# Patient Record
Sex: Female | Born: 1955 | Race: White | Hispanic: No | Smoking: Former smoker
Health system: Southern US, Community
[De-identification: ages and names within clinical notes are randomized; demographics above are authoritative.]

## PROBLEM LIST (undated history)

## (undated) DIAGNOSIS — R079 Chest pain, unspecified: Secondary | ICD-10-CM

## (undated) DIAGNOSIS — R609 Edema, unspecified: Secondary | ICD-10-CM

## (undated) DIAGNOSIS — M25069 Hemarthrosis, unspecified knee: Secondary | ICD-10-CM

## (undated) DIAGNOSIS — R042 Hemoptysis: Secondary | ICD-10-CM

## (undated) DIAGNOSIS — I10 Essential (primary) hypertension: Secondary | ICD-10-CM

## (undated) DIAGNOSIS — F419 Anxiety disorder, unspecified: Secondary | ICD-10-CM

## (undated) DIAGNOSIS — R110 Nausea: Secondary | ICD-10-CM

## (undated) DIAGNOSIS — M2550 Pain in unspecified joint: Secondary | ICD-10-CM

## (undated) DIAGNOSIS — N200 Calculus of kidney: Secondary | ICD-10-CM

## (undated) DIAGNOSIS — F329 Major depressive disorder, single episode, unspecified: Secondary | ICD-10-CM

## (undated) DIAGNOSIS — J449 Chronic obstructive pulmonary disease, unspecified: Secondary | ICD-10-CM

## (undated) DIAGNOSIS — F32A Depression, unspecified: Secondary | ICD-10-CM

## (undated) HISTORY — DX: Hemoptysis: R04.2

## (undated) HISTORY — PX: TUBAL LIGATION: SHX77

## (undated) HISTORY — DX: Chest pain, unspecified: R07.9

## (undated) HISTORY — DX: Hemarthrosis, unspecified knee: M25.069

## (undated) HISTORY — PX: KNEE SURGERY: SHX244

## (undated) HISTORY — DX: Nausea: R11.0

## (undated) HISTORY — DX: Chronic obstructive pulmonary disease, unspecified: J44.9

## (undated) HISTORY — DX: Pain in unspecified joint: M25.50

---

## 2005-01-31 ENCOUNTER — Emergency Department (HOSPITAL_COMMUNITY): Admission: EM | Admit: 2005-01-31 | Discharge: 2005-01-31 | Payer: Self-pay | Admitting: Emergency Medicine

## 2005-02-03 ENCOUNTER — Emergency Department (HOSPITAL_COMMUNITY): Admission: EM | Admit: 2005-02-03 | Discharge: 2005-02-04 | Payer: Self-pay | Admitting: Emergency Medicine

## 2008-01-22 ENCOUNTER — Emergency Department (HOSPITAL_BASED_OUTPATIENT_CLINIC_OR_DEPARTMENT_OTHER): Admission: EM | Admit: 2008-01-22 | Discharge: 2008-01-22 | Payer: Self-pay | Admitting: Emergency Medicine

## 2008-02-02 ENCOUNTER — Encounter: Admission: RE | Admit: 2008-02-02 | Discharge: 2008-02-02 | Payer: Self-pay | Admitting: Gastroenterology

## 2008-02-05 ENCOUNTER — Emergency Department (HOSPITAL_BASED_OUTPATIENT_CLINIC_OR_DEPARTMENT_OTHER): Admission: EM | Admit: 2008-02-05 | Discharge: 2008-02-05 | Payer: Self-pay | Admitting: Emergency Medicine

## 2008-02-09 ENCOUNTER — Encounter: Admission: RE | Admit: 2008-02-09 | Discharge: 2008-02-09 | Payer: Self-pay | Admitting: Gastroenterology

## 2008-02-12 ENCOUNTER — Emergency Department (HOSPITAL_BASED_OUTPATIENT_CLINIC_OR_DEPARTMENT_OTHER): Admission: EM | Admit: 2008-02-12 | Discharge: 2008-02-12 | Payer: Self-pay | Admitting: Emergency Medicine

## 2008-02-13 ENCOUNTER — Emergency Department (HOSPITAL_BASED_OUTPATIENT_CLINIC_OR_DEPARTMENT_OTHER): Admission: EM | Admit: 2008-02-13 | Discharge: 2008-02-13 | Payer: Self-pay | Admitting: Emergency Medicine

## 2008-02-22 ENCOUNTER — Inpatient Hospital Stay (HOSPITAL_COMMUNITY): Admission: RE | Admit: 2008-02-22 | Discharge: 2008-02-24 | Payer: Self-pay | Admitting: Obstetrics and Gynecology

## 2008-02-22 ENCOUNTER — Encounter (INDEPENDENT_AMBULATORY_CARE_PROVIDER_SITE_OTHER): Payer: Self-pay | Admitting: Obstetrics and Gynecology

## 2008-03-04 ENCOUNTER — Encounter: Admission: RE | Admit: 2008-03-04 | Discharge: 2008-03-04 | Payer: Self-pay | Admitting: Obstetrics and Gynecology

## 2008-03-07 ENCOUNTER — Emergency Department (HOSPITAL_BASED_OUTPATIENT_CLINIC_OR_DEPARTMENT_OTHER): Admission: EM | Admit: 2008-03-07 | Discharge: 2008-03-07 | Payer: Self-pay | Admitting: Emergency Medicine

## 2008-04-13 ENCOUNTER — Emergency Department (HOSPITAL_BASED_OUTPATIENT_CLINIC_OR_DEPARTMENT_OTHER): Admission: EM | Admit: 2008-04-13 | Discharge: 2008-04-13 | Payer: Self-pay | Admitting: Emergency Medicine

## 2008-05-22 ENCOUNTER — Emergency Department (HOSPITAL_BASED_OUTPATIENT_CLINIC_OR_DEPARTMENT_OTHER): Admission: EM | Admit: 2008-05-22 | Discharge: 2008-05-22 | Payer: Self-pay | Admitting: Emergency Medicine

## 2008-07-01 HISTORY — PX: ABDOMINAL HYSTERECTOMY: SHX81

## 2008-07-01 HISTORY — PX: APPENDECTOMY: SHX54

## 2008-08-01 ENCOUNTER — Emergency Department (HOSPITAL_BASED_OUTPATIENT_CLINIC_OR_DEPARTMENT_OTHER): Admission: EM | Admit: 2008-08-01 | Discharge: 2008-08-01 | Payer: Self-pay | Admitting: Emergency Medicine

## 2009-02-06 ENCOUNTER — Emergency Department (HOSPITAL_BASED_OUTPATIENT_CLINIC_OR_DEPARTMENT_OTHER): Admission: EM | Admit: 2009-02-06 | Discharge: 2009-02-06 | Payer: Self-pay | Admitting: Emergency Medicine

## 2009-02-06 ENCOUNTER — Ambulatory Visit: Payer: Self-pay | Admitting: Diagnostic Radiology

## 2009-03-15 ENCOUNTER — Emergency Department (HOSPITAL_BASED_OUTPATIENT_CLINIC_OR_DEPARTMENT_OTHER): Admission: EM | Admit: 2009-03-15 | Discharge: 2009-03-16 | Payer: Self-pay | Admitting: Emergency Medicine

## 2009-03-15 ENCOUNTER — Emergency Department (HOSPITAL_BASED_OUTPATIENT_CLINIC_OR_DEPARTMENT_OTHER): Admission: EM | Admit: 2009-03-15 | Discharge: 2009-03-15 | Payer: Self-pay | Admitting: Emergency Medicine

## 2009-03-15 ENCOUNTER — Ambulatory Visit: Payer: Self-pay | Admitting: Radiology

## 2009-03-16 ENCOUNTER — Ambulatory Visit: Payer: Self-pay | Admitting: Diagnostic Radiology

## 2009-07-01 HISTORY — PX: COLON SURGERY: SHX602

## 2009-07-06 ENCOUNTER — Ambulatory Visit: Payer: Self-pay | Admitting: Diagnostic Radiology

## 2009-07-06 ENCOUNTER — Emergency Department (HOSPITAL_BASED_OUTPATIENT_CLINIC_OR_DEPARTMENT_OTHER): Admission: EM | Admit: 2009-07-06 | Discharge: 2009-07-06 | Payer: Self-pay | Admitting: Emergency Medicine

## 2009-09-13 ENCOUNTER — Emergency Department (HOSPITAL_BASED_OUTPATIENT_CLINIC_OR_DEPARTMENT_OTHER): Admission: EM | Admit: 2009-09-13 | Discharge: 2009-09-13 | Payer: Self-pay | Admitting: Emergency Medicine

## 2010-05-14 ENCOUNTER — Ambulatory Visit: Payer: Self-pay | Admitting: Diagnostic Radiology

## 2010-05-14 ENCOUNTER — Emergency Department (HOSPITAL_BASED_OUTPATIENT_CLINIC_OR_DEPARTMENT_OTHER): Admission: EM | Admit: 2010-05-14 | Discharge: 2010-05-14 | Payer: Self-pay | Admitting: Emergency Medicine

## 2010-05-27 ENCOUNTER — Ambulatory Visit: Payer: Self-pay | Admitting: Diagnostic Radiology

## 2010-05-27 ENCOUNTER — Emergency Department (HOSPITAL_BASED_OUTPATIENT_CLINIC_OR_DEPARTMENT_OTHER): Admission: EM | Admit: 2010-05-27 | Discharge: 2010-05-27 | Payer: Self-pay | Admitting: Emergency Medicine

## 2010-10-05 LAB — BASIC METABOLIC PANEL
CO2: 24 mEq/L (ref 19–32)
Chloride: 108 mEq/L (ref 96–112)
Creatinine, Ser: 0.6 mg/dL (ref 0.4–1.2)
GFR calc Af Amer: >60 mL/min (ref >60)
Glucose, Bld: 90 mg/dL (ref 70–99)
Sodium: 143 mEq/L (ref 135–145)

## 2010-10-05 LAB — URINALYSIS, ROUTINE W REFLEX MICROSCOPIC
Bilirubin Urine: NEGATIVE
Bilirubin Urine: NEGATIVE
Glucose, UA: NEGATIVE mg/dL
Hgb urine dipstick: NEGATIVE
Ketones, ur: NEGATIVE mg/dL
Nitrite: NEGATIVE
Protein, ur: NEGATIVE mg/dL
Protein, ur: NEGATIVE mg/dL
Urobilinogen, UA: 0.2 mg/dL (ref 0.0–1.0)
Urobilinogen, UA: 0.2 mg/dL (ref 0.0–1.0)

## 2010-10-05 LAB — CBC
HCT: 36.6 % (ref 36.0–46.0)
Platelets: 313 10*3/uL (ref 150–400)
RDW: 13.3 % (ref 11.5–15.5)

## 2010-10-05 LAB — POCT TOXICOLOGY PANEL: Opiates: POSITIVE

## 2010-10-05 LAB — COMPREHENSIVE METABOLIC PANEL
Albumin: 3.9 g/dL (ref 3.5–5.2)
Alkaline Phosphatase: 80 U/L (ref 39–117)
BUN: 18 mg/dL (ref 6–23)
GFR calc Af Amer: >60 mL/min (ref >60)
Potassium: 5 mEq/L (ref 3.5–5.1)
Sodium: 143 mEq/L (ref 135–145)
Total Protein: 7.3 g/dL (ref 6.0–8.3)

## 2010-10-05 LAB — SALICYLATE LEVEL: Salicylate Lvl: 1 mg/dL — ABNORMAL LOW (ref 2.8–20.0)

## 2010-10-05 LAB — ACETAMINOPHEN LEVEL: Acetaminophen (Tylenol), Serum: <10 ug/mL — ABNORMAL LOW (ref 10–30)

## 2010-10-05 LAB — DIFFERENTIAL
Basophils Relative: 1 % (ref 0–1)
Monocytes Absolute: 0.4 10*3/uL (ref 0.1–1.0)
Monocytes Relative: 8 % (ref 3–12)
Neutro Abs: 3.5 10*3/uL (ref 1.7–7.7)

## 2010-10-05 LAB — ETHANOL: Alcohol, Ethyl (B): <5 mg/dL (ref 0–10)

## 2010-10-05 LAB — LIPASE, BLOOD: Lipase: 205 U/L (ref 23–300)

## 2010-10-06 LAB — LIPASE, BLOOD: Lipase: 92 U/L (ref 23–300)

## 2010-10-06 LAB — DIFFERENTIAL
Eosinophils Absolute: 0 10*3/uL (ref 0.0–0.7)
Lymphocytes Relative: 14 % (ref 12–46)
Lymphs Abs: 1.1 10*3/uL (ref 0.7–4.0)
Neutro Abs: 5.9 10*3/uL (ref 1.7–7.7)
Neutrophils Relative %: 73 % (ref 43–77)

## 2010-10-06 LAB — URINALYSIS, ROUTINE W REFLEX MICROSCOPIC
Ketones, ur: 15 mg/dL — AB
Nitrite: NEGATIVE
Specific Gravity, Urine: 1.022 (ref 1.005–1.030)
pH: 6 (ref 5.0–8.0)

## 2010-10-06 LAB — CBC
Hemoglobin: 16.5 g/dL — ABNORMAL HIGH (ref 12.0–15.0)
MCHC: 34 g/dL (ref 30.0–36.0)
MCV: 98 fL (ref 78.0–100.0)
RBC: 4.95 MIL/uL (ref 3.87–5.11)
RDW: 11.8 % (ref 11.5–15.5)

## 2010-10-06 LAB — COMPREHENSIVE METABOLIC PANEL
CO2: 30 mEq/L (ref 19–32)
Calcium: 9.9 mg/dL (ref 8.4–10.5)
Creatinine, Ser: 0.6 mg/dL (ref 0.4–1.2)
GFR calc non Af Amer: >60 mL/min (ref >60)
Glucose, Bld: 121 mg/dL — ABNORMAL HIGH (ref 70–99)
Total Protein: 7.1 g/dL (ref 6.0–8.3)

## 2010-11-06 ENCOUNTER — Emergency Department (INDEPENDENT_AMBULATORY_CARE_PROVIDER_SITE_OTHER): Payer: Self-pay

## 2010-11-06 ENCOUNTER — Emergency Department (HOSPITAL_BASED_OUTPATIENT_CLINIC_OR_DEPARTMENT_OTHER)
Admission: EM | Admit: 2010-11-06 | Discharge: 2010-11-07 | Disposition: A | Discharge status: Home / Self Care | Payer: Self-pay | Attending: Emergency Medicine | Admitting: Emergency Medicine

## 2010-11-06 DIAGNOSIS — M47812 Spondylosis without myelopathy or radiculopathy, cervical region: Secondary | ICD-10-CM | POA: Insufficient documentation

## 2010-11-06 DIAGNOSIS — M79609 Pain in unspecified limb: Secondary | ICD-10-CM

## 2010-11-06 DIAGNOSIS — W108XXA Fall (on) (from) other stairs and steps, initial encounter: Secondary | ICD-10-CM

## 2010-11-06 DIAGNOSIS — S0180XA Unspecified open wound of other part of head, initial encounter: Secondary | ICD-10-CM | POA: Insufficient documentation

## 2010-11-06 DIAGNOSIS — Z043 Encounter for examination and observation following other accident: Secondary | ICD-10-CM

## 2010-11-06 DIAGNOSIS — F17200 Nicotine dependence, unspecified, uncomplicated: Secondary | ICD-10-CM | POA: Insufficient documentation

## 2010-11-06 DIAGNOSIS — R51 Headache: Secondary | ICD-10-CM

## 2010-11-13 NOTE — Op Note (Signed)
NAMEMarland Kitchen  Annette Palmer NO.:  [id number]   MEDICAL RECORD NO.:  [id number]          PATIENT TYPE:  INP   LOCATION:  9320                          FACILITY:  [facility]   PHYSICIAN:  Gerald Leitz, MD          DATE OF BIRTH:  [birth year]   DATE OF PROCEDURE:  02/22/2008  DATE OF DISCHARGE:                               OPERATIVE REPORT   PREOPERATIVE DIAGNOSES:  1. Pelvic pain.  2. Right adnexal mass.   POSTOPERATIVE DIAGNOSIS:  1. Pelvic pain.  2. Right adnexal mass.  3. Pelvic adhesive disease.   PROCEDURE:  Exploratory laparotomy, extensive lysis of adhesions, right  ureterolysis, removal of the right adnexal mass and partial omentectomy.   SURGEON:  Gerald Leitz, M.D.   ASSISTANT:  Dr. Bing Neighbors. [place].   ANESTHESIA:  General.   FINDINGS:  Extensive pelvic adhesive disease, adhesions of the right  adnexal mass of the right ureter and colon.   SPECIMEN:  Right adnexal mass, portions of the omentum.   DISPOSITION:  The specimen pathology.   ESTIMATED BLOOD LOSS:  300 mL.   COMPLICATIONS:  None.   PROCEDURE:  The patient is taken to the operating room where she was  placed under general anesthesia.  She was prepped and draped in the  usual sterile fashion.  A Pfannenstiel skin incision was made with  scalpel, carried down to the underlying layer of fascia.  The fascia was  incised in the midline and the incision was extended laterally with Mayo  scissors.  The superior aspect of the fascial incision was grasped with  Kocher clamps, elevated underlying rectus muscles were dissected off.  This was repeated on the inferior aspect of the fascial incision.  The  rectus muscles were separated in the midline.  The peritoneum was  identified and entered bluntly.  She was noted to have some adhesions in  the omentum to the anterior abdominal wall.  These were clamped with  Kelly clamps, transected, suture ligated with 0-Vicryl.  Excellent  hemostasis was noted.   Small portion of the omentum was sent to  pathology.  Balfour retractor was placed into the abdominal cavity.  The  bowel was packed away with moist laparotomy sponges.  The patient was  found to have adhesions of the colon to the right adnexal mass.  These  adhesions were taken down via sharp dissection very carefully.  Lysis of  adhesions took approximately an hour and 50 minutes of the case.  She  has also noted to have adhesion of the adnexal mass to the right ureter.  The ureter was isolated with a vessel loop.  It was dissected from the  right adnexal mass.  No definitive right ovary was seen and no  infundibulopelvic ligaments were identified.  The patient was noted to  have some adhesion of the right adnexal mass to the sidewall this was  dissected with sharp dissection.  The adnexal mass was removed.  The  patient was noted to have some oozing from the pelvic sidewall.  Hemostasis was obtained with electrocautery as  well as medium clamps.  She is also noted to have some bleeding along the ureter.  Medium  surgical clamps were placed taking care to avoid the ureter.  Hemostasis  was assured.  Avitene was placed once hemostasis was assured.  Specimen  was removed.  All of the instruments and sponges were removed from the  abdomen and the fascia was re-approximated with 0 PDS.  Skin was closed  with staples.  Sponge lap, needle counts were correct x2.  The patient  was given 900 mg of clindamycin prior to beginning the procedure.  She  was taken to the recovery room awake and in stable condition.      Gerald Leitz, MD  Electronically Signed     TC/MEDQ  D:  02/22/2008  T:  02/23/2008  Job:  [id number]

## 2010-11-13 NOTE — H&P (Signed)
NAME:  Annette Palmer NO.:  [id number]   MEDICAL RECORD NO.:  [id number]          PATIENT TYPE:  AMB   LOCATION:  SDC                           [facility]ILITY:  [facility]   PHYSICIAN:  Gerald Leitz, MD          DATE OF BIRTH:  Apr 26, 1956   DATE OF ADMISSION:  DATE OF DISCHARGE:                              HISTORY & PHYSICAL   The patient is scheduled for surgery on February 22, 2008.   HISTORY OF PRESENT ILLNESS:  This 55 year old G2, P2 with right lower  quadrant pelvic pain for the past 4 months, who had a CT scan performed  on February 02, 2008, at [facility] which showed a 6-cm simple  right ovarian cyst.  The patient is status post abdominal hysterectomy,  left oophorectomy, as well as appendectomy at the age of 40 due to  ovarian cyst at that time.  She desires removal of the right ovary, I  given the significant pain that is uncontrolled with Tylenol and  Percocet.   PAST OB HISTORY:  Spontaneous vaginal delivery x2.   GYN HISTORY:  History of hysterectomy, left oophorectomy at the age of  7, utero-ovarian cyst.  The patient is unsure of her last Pap smear.  No history of sexually transmitted diseases.   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   ALLERGIES:  PENICILLIN and SULFA.   CURRENT MEDICATIONS:  Zoloft.   SURGERIES:  Hysterectomy, appendectomy, left salpingo-oophorectomy, and  left knee surgery.   SOCIAL HISTORY:  The patient is divorced.  She is currently unemployed.  She smokes approximately a pack per day of cigarettes over the past 15  years.  She denies alcohol or illicit drug use   FAMILY HISTORY:  Negative for breast, ovarian, or colon cancer.   REVIEW OF SYSTEMS:  Positive for nausea.  No emesis, no diarrhea,  constipation, nor dysuria.   PHYSICAL EXAMINATION:  VITAL SIGNS:  Blood pressure 124/80, height 65  inches, weight 110 pounds, and temperature 97.2.  CARDIOVASCULAR:  Regular rate and rhythm.  LUNGS:   Clear to auscultation bilaterally.  ABDOMEN:  Soft and tender in the right lower quadrant.  Positive bowel  sounds.  No rebound.  No guarding.  EXTREMITIES:  No clubbing, cyanosis, or edema.  PELVIC:  Normal external female genitalia.  No vaginal lesions noted.  Cervix is absent.  Right adnexal mass with tenderness to palpation.   IMPRESSION AND PLAN:  Pelvic pain most likely secondary to right ovarian  cyst.  The patient desires therapy with right oophorectomy.  Risks,  benefits, and alternatives of the surgery were discussed with the  patient including, but not limited to infection, bleeding, damage to  bowel, bladder, or surrounding organs with the need for further  surgery.  She is informed that the right ovary may not be the cause of  her pelvic pain and if it is not the source, her pelvic pain may  continue after surgery.  She voiced understanding of all risks and  desires to proceed with right oophorectomy.  Gerald Leitz, MD  Electronically Signed     TC/MEDQ  D:  02/18/2008  T:  02/19/2008  Job:  [id number]

## 2010-11-16 NOTE — Discharge Summary (Signed)
NAMEMarland Kitchen  Annette Palmer NO.:  [id number]   MEDICAL RECORD NO.:  [id number]          PATIENT TYPE:  INP   LOCATION:  9320                          [facility]   PHYSICIAN:  Gerald Leitz, MD          DATE OF BIRTH:  1956/04/24   DATE OF ADMISSION:  02/22/2008  DATE OF DISCHARGE:  02/24/2008                               DISCHARGE SUMMARY   ADMISSION DIAGNOSES:  1. Pelvic pain.  2. Right adnexal mass.   DISCHARGE DIAGNOSES:  1. Pelvic pain.  2. Right adnexal mass.  3. Status post exploratory laparotomy with extensive lysis of      adhesions, right ureterolysis, removal of right adnexal mass with      partial omentectomy.   BRIEF HOSPITAL COURSE:  The patient was admitted on February 22, 2008  after undergoing exploratory laparotomy, extensive lysis of adhesions,  right ureterolysis, and removal of right adnexal mass with partial  omentectomy secondary to pelvic pain and right adnexal mass.  She did  well postoperatively.  On postop day #1, her hemoglobin was 11.8.  She  was discharged home on postop day #2 on the following medications,  Motrin and Percocet.  She was to follow up for staple removal on February 26, 2008 and for postoperative visit 4 weeks later.   CONDITION AT DISCHARGE:  Improved and stable.       Gerald Leitz, MD  Electronically Signed     TC/MEDQ  D:  03/15/2008  T:  03/16/2008  Job:  [id number]

## 2011-03-29 LAB — URINALYSIS, ROUTINE W REFLEX MICROSCOPIC
Bilirubin Urine: NEGATIVE
Bilirubin Urine: NEGATIVE
Glucose, UA: NEGATIVE
Hgb urine dipstick: NEGATIVE
Ketones, ur: NEGATIVE
Nitrite: NEGATIVE
Specific Gravity, Urine: 1.026
pH: 6
pH: 6

## 2011-03-29 LAB — DIFFERENTIAL
Basophils Relative: 1
Eosinophils Absolute: 0.1
Monocytes Absolute: 0.4
Monocytes Relative: 9
Neutrophils Relative %: 47

## 2011-03-29 LAB — COMPREHENSIVE METABOLIC PANEL
ALT: 19
Albumin: 4
Alkaline Phosphatase: 48
Chloride: 108
Glucose, Bld: 86
Potassium: 4.2
Sodium: 142
Total Protein: 6.9

## 2011-03-29 LAB — CBC
Hemoglobin: 14.4
RDW: 12.5
WBC: 4.5

## 2011-08-14 ENCOUNTER — Encounter (HOSPITAL_BASED_OUTPATIENT_CLINIC_OR_DEPARTMENT_OTHER): Payer: Self-pay | Admitting: *Deleted

## 2011-08-14 ENCOUNTER — Emergency Department (HOSPITAL_BASED_OUTPATIENT_CLINIC_OR_DEPARTMENT_OTHER)
Admission: EM | Admit: 2011-08-14 | Discharge: 2011-08-14 | Disposition: A | Discharge status: Home / Self Care | Payer: Self-pay | Attending: Emergency Medicine | Admitting: Emergency Medicine

## 2011-08-14 DIAGNOSIS — F10929 Alcohol use, unspecified with intoxication, unspecified: Secondary | ICD-10-CM

## 2011-08-14 DIAGNOSIS — F1010 Alcohol abuse, uncomplicated: Secondary | ICD-10-CM | POA: Insufficient documentation

## 2011-08-14 NOTE — ED Provider Notes (Signed)
History     CSN: [id number]  Arrival date & time 08/14/11  1478   First MD Initiated Contact with Patient 08/14/11 2045      Chief Complaint  Patient presents with  . Alcohol Intoxication    (Consider location/radiation/quality/duration/timing/severity/associated sxs/prior treatment) Patient is a 56 y.o. female presenting with intoxication. The history is provided by the patient and a friend. No language interpreter was used.  Alcohol Intoxication This is a new problem. The current episode started today. The problem occurs constantly. The problem has been unchanged. Pertinent negatives include no vomiting. The symptoms are aggravated by nothing. She has tried nothing for the symptoms. The treatment provided no relief.   Pt took 2 valium and a 10 mg morphine at 3pm today.  Pt had 5 shots of alcohol.  Pt sent here by her Preacher because he was concerned about medications pt took.  Pt reports she had 3 family members die this week.  Pt reports medications were hers.  Pt is here with a friend who will be with her.  Pt denies suicide attempt.  Pt reports she took to get through.   History reviewed. No pertinent past medical history.  History reviewed. No pertinent past surgical history.  No family history on file.  History  Substance Use Topics  . Smoking status: Current Everyday Smoker  . Smokeless tobacco: Not on file  . Alcohol Use: Yes    OB History    Grav Para Term Preterm Abortions TAB SAB Ect Mult Living                  Review of Systems  Gastrointestinal: Negative for vomiting.  Psychiatric/Behavioral: Negative for suicidal ideas, hallucinations, confusion, self-injury and agitation. The patient is nervous/anxious.   All other systems reviewed and are negative.    Allergies  Penicillins  Home Medications   Current Outpatient Rx  Name Route Sig Dispense Refill  . ALPRAZOLAM 1 MG PO TABS Oral Take 1 mg by mouth at bedtime.    . CYANOCOBALAMIN 5000 MCG/ML SL  LIQD Sublingual Place 5,000 mcg under the tongue daily.    Marland Kitchen PRENATAL MULTIVITAMIN CH Oral Take 1 tablet by mouth daily.    . SERTRALINE HCL 100 MG PO TABS Oral Take 100 mg by mouth daily.      BP 130/94  Pulse 75  Temp(Src) 97.6 F (36.4 C) (Oral)  Resp 18  SpO2 100%  Physical Exam  Nursing note and vitals reviewed. Constitutional: She appears well-developed and well-nourished.  HENT:  Head: Normocephalic and atraumatic.  Right Ear: External ear normal.  Left Ear: External ear normal.  Nose: Nose normal.  Mouth/Throat: Oropharynx is clear and moist.  Eyes: Conjunctivae and EOM are normal. Pupils are equal, round, and reactive to light.  Neck: Normal range of motion. Neck supple.  Cardiovascular: Normal rate.   Pulmonary/Chest: Effort normal.  Abdominal: Soft.  Neurological: She is alert.  Skin: Skin is warm.  Psychiatric: Thought content normal.    ED Course  Procedures (including critical care time)  Labs Reviewed  ETHANOL - Abnormal; Notable for the following:    Alcohol, Ethyl (B) 142 (*)    All other components within normal limits   No results found.   No diagnosis found.    MDM  Pt agrees to not drink or take any other medication today.  Pt observed in ED.  Pt is awake and alert the complete time.  Pt eating and drinking normally.  Pt's friend will  be with her tonight and agrees to return if any problems.        Langston Masker, [place] 08/14/11 2057

## 2011-08-14 NOTE — ED Notes (Signed)
Son ,daughter -in-law and granddaughter all died x 4 days ago by drowning.  Was at funeral home tonight.  Admits to having 5 shots of liquer earlier today and her usual xanax and zoloft.  Family was concerned and wanted her checked.

## 2011-08-14 NOTE — ED Notes (Signed)
Given something to eat and drink  And tolerated well

## 2011-08-14 NOTE — Discharge Instructions (Signed)
Alcohol Intoxication Alcohol intoxication means your blood alcohol level is above legal limits. Alcohol is a drug. It has serious side effects. These side effects can include:  Damage to your organs (liver, nervous system, and blood system).   Unclear thinking.   Slowed reflexes.   Decreased muscle coordination.  HOME CARE  Do not drink and drive.   Do not drink alcohol if you are taking medicine or using other drugs. Doing so can cause serious medical problems or even death.   Drink enough water and fluids to keep your pee (urine) clear or pale yellow.   Eat healthy foods.   Only take medicine as told by your doctor.   Join an alcohol support group.  GET HELP RIGHT AWAY IF:  You become shaky when you stop drinking.   Your thinking is unclear or you become confused.   You throw up (vomit) blood. It may look bright red or like coffee grounds.   You notice blood in your poop (bowel movements).   You become lightheaded or pass out (faint).  MAKE SURE YOU:   Understand these instructions.   Will watch your condition.   Will get help right away if you are not doing well or get worse.  Document Released: 12/04/2007 Document Revised: 02/27/2011 Document Reviewed: 12/04/2009 [facility] Patient Information 2012 [place], [place].  Do not take any other medications tonight.  Return to the Emergency department if increased sedation or problems.

## 2011-08-14 NOTE — ED Notes (Addendum)
Patient has taken "2 valium", "5 shots", and "one morphine". Patient is coherent and answering questions appropriately. Patient admits to substances taken tonight, but is also coming to [place] from the funeral home for  her son who drowned recently (with his wife and daughter)

## 2011-08-19 NOTE — ED Provider Notes (Signed)
History/physical exam/procedure(s) were performed by non-physician practitioner and as supervising physician I was immediately available for consultation/collaboration. I have reviewed all notes and am in agreement with care and plan.   Baer Hinton S Jochebed Bills, MD 08/19/11 0724 

## 2011-10-24 ENCOUNTER — Emergency Department (INDEPENDENT_AMBULATORY_CARE_PROVIDER_SITE_OTHER): Payer: Self-pay

## 2011-10-24 ENCOUNTER — Encounter (HOSPITAL_BASED_OUTPATIENT_CLINIC_OR_DEPARTMENT_OTHER): Payer: Self-pay | Admitting: *Deleted

## 2011-10-24 ENCOUNTER — Emergency Department (HOSPITAL_BASED_OUTPATIENT_CLINIC_OR_DEPARTMENT_OTHER)
Admission: EM | Admit: 2011-10-24 | Discharge: 2011-10-24 | Disposition: A | Discharge status: Home / Self Care | Payer: Self-pay | Attending: Emergency Medicine | Admitting: Emergency Medicine

## 2011-10-24 DIAGNOSIS — R0789 Other chest pain: Secondary | ICD-10-CM | POA: Insufficient documentation

## 2011-10-24 DIAGNOSIS — R079 Chest pain, unspecified: Secondary | ICD-10-CM

## 2011-10-24 DIAGNOSIS — I10 Essential (primary) hypertension: Secondary | ICD-10-CM | POA: Insufficient documentation

## 2011-10-24 DIAGNOSIS — F341 Dysthymic disorder: Secondary | ICD-10-CM | POA: Insufficient documentation

## 2011-10-24 HISTORY — DX: Major depressive disorder, single episode, unspecified: F32.9

## 2011-10-24 HISTORY — DX: Essential (primary) hypertension: I10

## 2011-10-24 HISTORY — DX: Depression, unspecified: F32.A

## 2011-10-24 HISTORY — DX: Anxiety disorder, unspecified: F41.9

## 2011-10-24 LAB — COMPREHENSIVE METABOLIC PANEL
Albumin: 4.6 g/dL (ref 3.5–5.2)
Alkaline Phosphatase: 44 U/L (ref 39–117)
BUN: 17 mg/dL (ref 6–23)
Calcium: 10.2 mg/dL (ref 8.4–10.5)
Creatinine, Ser: 0.7 mg/dL (ref 0.50–1.10)
GFR calc Af Amer: >90 mL/min (ref >90)
Glucose, Bld: 90 mg/dL (ref 70–99)
Potassium: 4.5 mEq/L (ref 3.5–5.1)
Total Protein: 7.5 g/dL (ref 6.0–8.3)

## 2011-10-24 LAB — CBC
Hemoglobin: 15.2 g/dL — ABNORMAL HIGH (ref 12.0–15.0)
MCH: 31.8 pg (ref 26.0–34.0)
MCHC: 35.2 g/dL (ref 30.0–36.0)
MCV: 90.4 fL (ref 78.0–100.0)

## 2011-10-24 LAB — DIFFERENTIAL
Basophils Relative: 0 % (ref 0–1)
Eosinophils Absolute: 0.1 10*3/uL (ref 0.0–0.7)
Eosinophils Relative: 1 % (ref 0–5)
Lymphs Abs: 1.9 10*3/uL (ref 0.7–4.0)
Monocytes Relative: 6 % (ref 3–12)
Neutrophils Relative %: 70 % (ref 43–77)

## 2011-10-24 LAB — CARDIAC PANEL(CRET KIN+CKTOT+MB+TROPI)
CK, MB: 2.3 ng/mL (ref 0.3–4.0)
Relative Index: INVALID (ref 0.0–2.5)
Total CK: 88 U/L (ref 7–177)
Troponin I: <0.3 ng/mL (ref <0.30)

## 2011-10-24 LAB — LIPASE, BLOOD: Lipase: 69 U/L — ABNORMAL HIGH (ref 11–59)

## 2011-10-24 MED ORDER — HYDROCODONE-ACETAMINOPHEN 5-500 MG PO TABS: 1.0000 | ORAL_TABLET | Freq: Four times a day (QID) | ORAL | Status: AC | PRN

## 2011-10-24 NOTE — ED Notes (Signed)
PCXR obtained.

## 2011-10-24 NOTE — ED Provider Notes (Signed)
History     CSN: [id number]  Arrival date & time 10/24/11  1555   First MD Initiated Contact with Patient 10/24/11 1607      No chief complaint on file.   (Consider location/radiation/quality/duration/timing/severity/associated sxs/prior treatment) HPI Comments: Patient states chest pain for the past nine months.  It is sharp in nature, comes and goes.  Does not seem to be associated with exertion or cough.  No fevers or chills.  She does smoke 1-2 ppd.    Patient is a 56 y.o. female presenting with chest pain. The history is provided by the patient.  Chest Pain     Past Medical History  Diagnosis Date  . Hypertension   . Depression   . Anxiety     Past Surgical History  Procedure Date  . Abdominal hysterectomy   . Knee surgery   . Colon surgery     No family history on file.  History  Substance Use Topics  . Smoking status: Current Everyday Smoker -- 1.0 packs/day  . Smokeless tobacco: Not on file  . Alcohol Use: Yes    OB History    Grav Para Term Preterm Abortions TAB SAB Ect Mult Living                  Review of Systems  Cardiovascular: Positive for chest pain.  All other systems reviewed and are negative.    Allergies  Penicillins  Home Medications   Current Outpatient Rx  Name Route Sig Dispense Refill  . ALPRAZOLAM 1 MG PO TABS Oral Take 1 mg by mouth at bedtime.    . CYANOCOBALAMIN 5000 MCG/ML SL LIQD Sublingual Place 5,000 mcg under the tongue daily.    Marland Kitchen PRENATAL MULTIVITAMIN CH Oral Take 1 tablet by mouth daily.    . SERTRALINE HCL 100 MG PO TABS Oral Take 100 mg by mouth daily.      BP 170/82  Pulse 76  Temp(Src) 98.3 F (36.8 C) (Oral)  Resp 20  Ht 5\' 6"  (1.676 m)  Wt 101 lb (45.813 kg)  BMI 16.30 kg/m2  SpO2 100%  Physical Exam  Nursing note and vitals reviewed. Constitutional: She is oriented to person, place, and time. She appears well-developed and well-nourished. No distress.  HENT:  Head: Normocephalic and  atraumatic.  Neck: Normal range of motion. Neck supple.  Cardiovascular: Normal rate and regular rhythm.  Exam reveals no gallop and no friction rub.   No murmur heard. Pulmonary/Chest: Effort normal and breath sounds normal. No respiratory distress. She has no wheezes.  Abdominal: Soft. Bowel sounds are normal. She exhibits no distension. There is no tenderness.  Musculoskeletal: Normal range of motion.  Neurological: She is alert and oriented to person, place, and time.  Skin: Skin is warm and dry. She is not diaphoretic.    ED Course  Procedures (including critical care time)   Labs Reviewed  CBC  DIFFERENTIAL  COMPREHENSIVE METABOLIC PANEL  LIPASE, BLOOD  CARDIAC PANEL(CRET KIN+CKTOT+MB+TROPI)   No results found.   No diagnosis found.   Date: 10/24/2011  Rate: 70  Rhythm: normal sinus rhythm  QRS Axis: normal  Intervals: normal  ST/T Wave abnormalities: normal  Conduction Disutrbances:none  Narrative Interpretation:   Old EKG Reviewed: unchanged    MDM  The labs and ekg look okay.  She has been having these symptoms for the past 9 months and have worsened since she lost her son to a drowning two months ago.  I doubt this is  cardiac.  She will be discharged with nsaids and lortab.  To follow up with pcp in the next few days, return prn if worsens.        Geoffery Lyons, MD 10/24/11 1739

## 2011-10-24 NOTE — ED Notes (Signed)
MD at bedside. 

## 2011-10-24 NOTE — Discharge Instructions (Signed)
Chest Pain (Nonspecific) Chest pain has many causes. Your pain could be caused by something serious, such as a heart attack or a blood clot in the lungs. It could also be caused by something less serious, such as a chest bruise or a virus. Follow up with your doctor. More lab tests or other studies may be needed to find the cause of your pain. Most of the time, nonspecific chest pain will improve within 2 to 3 days of rest and mild pain medicine. HOME CARE  For chest bruises, you may put ice on the sore area for 15 to 20 minutes, 3 to 4 times a day. Do this only if it makes you or your child feel better.   Put ice in a plastic bag.   Place a towel between the skin and the bag.   Rest for the next 2 to 3 days.   Go back to work if the pain improves.   See your doctor if the pain lasts longer than 1 to 2 weeks.   Only take medicine as told by your doctor.   Quit smoking if you smoke.  GET HELP RIGHT AWAY IF:   There is more pain or pain that spreads to the arm, neck, jaw, back, or belly (abdomen).   You or your child has shortness of breath.   You or your child coughs more than usual or coughs up blood.   You or your child has very bad back or belly pain, feels sick to his or her stomach (nauseous), or throws up (vomits).   You or your child has very bad weakness.   You or your child passes out (faints).   You or your child has a temperature by mouth above 102 F (38.9 C), not controlled by medicine.  Any of these problems may be serious and may be an emergency. Do not wait to see if the problems will go away. Get medical help right away. Call your local emergency services 911 in [place] Do not drive yourself to the hospital. MAKE SURE YOU:   Understand these instructions.   Will watch this condition.   Will get help right away if you or your child is not doing well or gets worse.  Document Released: 12/04/2007 Document Revised: 06/06/2011 Document Reviewed:  12/04/2007 [employer] Patient Information 2012 [employer]. 

## 2011-10-24 NOTE — ED Notes (Signed)
Report received from Babs Sciara, RN, care assumed.

## 2011-10-24 NOTE — ED Notes (Signed)
Productive cough with yellow and dark brown sputum.

## 2011-10-24 NOTE — ED Notes (Signed)
Sharp pain in her left chest for a week. States pain makes her "holler". Went to her MD and was told to come here. Stopped taking her BP medication over a month ago.

## 2011-12-29 ENCOUNTER — Emergency Department (HOSPITAL_BASED_OUTPATIENT_CLINIC_OR_DEPARTMENT_OTHER): Payer: Self-pay

## 2011-12-29 ENCOUNTER — Encounter (HOSPITAL_BASED_OUTPATIENT_CLINIC_OR_DEPARTMENT_OTHER): Payer: Self-pay | Admitting: *Deleted

## 2011-12-29 ENCOUNTER — Emergency Department (HOSPITAL_BASED_OUTPATIENT_CLINIC_OR_DEPARTMENT_OTHER)
Admission: EM | Admit: 2011-12-29 | Discharge: 2011-12-29 | Disposition: A | Discharge status: Home / Self Care | Payer: Self-pay | Attending: Emergency Medicine | Admitting: Emergency Medicine

## 2011-12-29 DIAGNOSIS — F17200 Nicotine dependence, unspecified, uncomplicated: Secondary | ICD-10-CM | POA: Insufficient documentation

## 2011-12-29 DIAGNOSIS — Z79899 Other long term (current) drug therapy: Secondary | ICD-10-CM | POA: Insufficient documentation

## 2011-12-29 DIAGNOSIS — I10 Essential (primary) hypertension: Secondary | ICD-10-CM | POA: Insufficient documentation

## 2011-12-29 DIAGNOSIS — R1011 Right upper quadrant pain: Secondary | ICD-10-CM | POA: Insufficient documentation

## 2011-12-29 DIAGNOSIS — F341 Dysthymic disorder: Secondary | ICD-10-CM | POA: Insufficient documentation

## 2011-12-29 DIAGNOSIS — G8929 Other chronic pain: Secondary | ICD-10-CM | POA: Insufficient documentation

## 2011-12-29 LAB — DIFFERENTIAL
Basophils Absolute: 0 10*3/uL (ref 0.0–0.1)
Lymphocytes Relative: 16 % (ref 12–46)
Lymphs Abs: 1.3 10*3/uL (ref 0.7–4.0)
Monocytes Absolute: 0.5 10*3/uL (ref 0.1–1.0)
Neutro Abs: 6.2 10*3/uL (ref 1.7–7.7)

## 2011-12-29 LAB — URINE MICROSCOPIC-ADD ON

## 2011-12-29 LAB — URINALYSIS, ROUTINE W REFLEX MICROSCOPIC
Hgb urine dipstick: NEGATIVE
Ketones, ur: NEGATIVE mg/dL
Nitrite: NEGATIVE
Protein, ur: 100 mg/dL — AB
Specific Gravity, Urine: 1.025 (ref 1.005–1.030)
Urobilinogen, UA: 0.2 mg/dL (ref 0.0–1.0)

## 2011-12-29 LAB — COMPREHENSIVE METABOLIC PANEL
ALT: 18 U/L (ref 0–35)
AST: 25 U/L (ref 0–37)
CO2: 26 mEq/L (ref 19–32)
Chloride: 106 mEq/L (ref 96–112)
GFR calc Af Amer: >90 mL/min (ref >90)
GFR calc non Af Amer: 83 mL/min — ABNORMAL LOW (ref >90)
Glucose, Bld: 92 mg/dL (ref 70–99)
Sodium: 142 mEq/L (ref 135–145)
Total Bilirubin: 0.3 mg/dL (ref 0.3–1.2)

## 2011-12-29 LAB — CBC
HCT: 39.1 % (ref 36.0–46.0)
MCV: 91.6 fL (ref 78.0–100.0)
Platelets: 161 10*3/uL (ref 150–400)
RBC: 4.27 MIL/uL (ref 3.87–5.11)
RDW: 12.4 % (ref 11.5–15.5)
WBC: 8 10*3/uL (ref 4.0–10.5)

## 2011-12-29 MED ORDER — OXYCODONE-ACETAMINOPHEN 5-325 MG PO TABS: ORAL_TABLET | ORAL | Status: DC

## 2011-12-29 MED ORDER — OXYCODONE-ACETAMINOPHEN 5-325 MG PO TABS
2.0000 | ORAL_TABLET | Freq: Once | ORAL | Status: AC
  Administered 2011-12-29: 2 via ORAL
  Filled 2011-12-29: qty 2

## 2011-12-29 MED ORDER — ONDANSETRON HCL 4 MG/2ML IJ SOLN
4.0000 mg | Freq: Once | INTRAMUSCULAR | Status: AC
  Administered 2011-12-29: 4 mg via INTRAVENOUS
  Filled 2011-12-29: qty 2

## 2011-12-29 MED ORDER — ONDANSETRON HCL 4 MG PO TABS: 4.0000 mg | ORAL_TABLET | Freq: Four times a day (QID) | ORAL | Status: AC

## 2011-12-29 MED ORDER — MORPHINE SULFATE 4 MG/ML IJ SOLN
4.0000 mg | Freq: Once | INTRAMUSCULAR | Status: AC
  Administered 2011-12-29: 4 mg via INTRAVENOUS
  Filled 2011-12-29: qty 1

## 2011-12-29 MED ORDER — SODIUM CHLORIDE 0.9 % IV BOLUS (SEPSIS)
1000.0000 mL | Freq: Once | INTRAVENOUS | Status: AC
  Administered 2011-12-29: 1000 mL via INTRAVENOUS

## 2011-12-29 NOTE — ED Notes (Signed)
Pt states she has had problems with her GB for several years. "Not diagnosed". Today was at church and developed RUQ pain. +nausea.

## 2011-12-29 NOTE — ED Notes (Signed)
Patient remains in Ultrasound.  

## 2011-12-29 NOTE — ED Provider Notes (Signed)
History     CSN: [id number]  Arrival date & time 12/29/11  1343   First MD Initiated Contact with Patient 12/29/11 1422      Chief Complaint  Patient presents with  . Abdominal Pain    (Consider location/radiation/quality/duration/timing/severity/associated sxs/prior treatment) HPI Patient is a 37 rolled female who presents today complaining of sudden onset of right upper quadrant abdominal pain while sitting at church today. She is concerned that this might be her gallbladder. She has a history of heavy tobacco use as well as chronic cough from this. She denies any change in this or fevers. Patient does not have any variation in her pain with deep breathing. She has no history of blood clots in her legs or in her lungs. Patient has history of some sort of abdominal surgery for her intestines but cannot recall what that is. Patient denies any urinary symptoms including increased urinary frequency, dysuria, or hematuria. She has history of kidney stones but reports that her pain is nothing like that. Patient endorses nausea but denies any vomiting. She is continued to have bowel movements though she reports that since the surgery performed on her intestines she has loose ones. She denies any blood in her stools. There are no other associated or modifying factors. Patient reports that her pain is an 8/10, intermittent, and sharp. There is no radiation. Past Medical History  Diagnosis Date  . Hypertension   . Depression   . Anxiety     Past Surgical History  Procedure Date  . Abdominal hysterectomy   . Knee surgery   . Colon surgery     History reviewed. No pertinent family history.  History  Substance Use Topics  . Smoking status: Current Everyday Smoker -- 1.0 packs/day  . Smokeless tobacco: Not on file  . Alcohol Use: Yes    OB History    Grav Para Term Preterm Abortions TAB SAB Ect Mult Living                  Review of Systems  Constitutional: Negative.   HENT:  Negative.   Eyes: Negative.   Respiratory: Positive for cough.   Cardiovascular: Negative.   Gastrointestinal: Positive for nausea, abdominal pain and diarrhea.  Genitourinary: Negative.   Musculoskeletal: Negative.   Neurological: Negative.   Hematological: Negative.   Psychiatric/Behavioral: Negative.   All other systems reviewed and are negative.    Allergies  Penicillins  Home Medications   Current Outpatient Rx  Name Route Sig Dispense Refill  . ALBUTEROL SULFATE HFA 108 (90 BASE) MCG/ACT IN AERS Inhalation Inhale into the lungs every 6 (six) hours as needed. Patient uses this medication for shortness of breath.    . ALPRAZOLAM 1 MG PO TABS Oral Take 1 mg by mouth at bedtime.    Marland Kitchen BEE POLLEN PO Oral Take 2 capsules by mouth daily.     . IBUPROFEN 200 MG PO TABS Oral Take 800 mg by mouth 2 (two) times daily as needed. For pain    . SERTRALINE HCL 100 MG PO TABS Oral Take 50 mg by mouth daily.     Marland Kitchen ONDANSETRON HCL 4 MG PO TABS Oral Take 1 tablet (4 mg total) by mouth every 6 (six) hours. 12 tablet 0  . OXYCODONE-ACETAMINOPHEN 5-325 MG PO TABS  Take 1-2 tabs by mouth every 6 hours as needed for pain. 30 tablet 0    BP 117/75  Pulse 58  Temp 97.9 F (36.6 C) (Oral)  Resp  12  Ht 5\' 6"  (1.676 m)  Wt 100 lb (45.36 kg)  BMI 16.14 kg/m2  SpO2 98%  Physical Exam  Nursing note and vitals reviewed. GEN: Well-developed, well-nourished female in no distress HEENT: Atraumatic, normocephalic. Oropharynx clear without erythema EYES: PERRLA BL, no scleral icterus. NECK: Trachea midline, no meningismus CV: regular rate and rhythm. No murmurs, rubs, or gallops PULM: No respiratory distress.  No crackles, wheezes, or rales. GI: soft, TTP in RUQ. No guarding, rebound. + bowel sounds  GU: deferred Neuro: cranial nerves grossly 2-12 intact, no abnormalities of strength or sensation, A and O x 3 MSK: Patient moves all 4 extremities symmetrically, no deformity, edema, or injury  noted Skin: No rashes petechiae, purpura, or jaundice Psych: no abnormality of mood   ED Course  Procedures (including critical care time)  Labs Reviewed  URINALYSIS, ROUTINE W REFLEX MICROSCOPIC - Abnormal; Notable for the following:    Bilirubin Urine SMALL (*)     Protein, ur 100 (*)     All other components within normal limits  URINE MICROSCOPIC-ADD ON - Abnormal; Notable for the following:    Bacteria, UA MANY (*)     Casts HYALINE CASTS (*)  GRANULAR CAST   Crystals CA OXALATE CRYSTALS (*)     All other components within normal limits  COMPREHENSIVE METABOLIC PANEL - Abnormal; Notable for the following:    GFR calc non Af Amer 83 (*)     All other components within normal limits  LIPASE, BLOOD - Abnormal; Notable for the following:    Lipase 63 (*)     All other components within normal limits  CBC  DIFFERENTIAL   Dg Chest 2 View  12/29/2011  *RADIOLOGY REPORT*  Clinical Data: Right-sided chest pain.  COPD.  CHEST - 2 VIEW  Comparison: 10/24/2011  Findings: Artifact overlies the chest.  Heart size is normal. Mediastinal shadows are normal.  The lungs are hyperinflated with increased interstitial markings.  No sign of infiltrate, mass, effusion or collapse.  No significant bony finding.  IMPRESSION: Chronic obstructive lung disease.  Areas of scarring but no sign of active process.  No cause of right-sided pain identified.  Original Report Authenticated By: Thomasenia Sales, M.D.   US Abdomen Complete  12/29/2011  *RADIOLOGY REPORT*  Clinical Data:  Abdominal pain  ABDOMINAL ULTRASOUND COMPLETE  Comparison:  CT from 02/06/2009  Findings:  Gallbladder:  No gallstones, gallbladder wall thickening, or pericholecystic fluid.  Common Bile Duct:  Within normal limits in caliber.  Liver: No focal mass lesion identified.  Within normal limits in parenchymal echogenicity.  IVC:  Appears normal.  Pancreas:  No abnormality identified.  Spleen:  Within normal limits in size and echotexture.   Right kidney:  Normal in size and parenchymal echogenicity.  No evidence of mass or hydronephrosis.  Left kidney:  Normal in size and parenchymal echogenicity.  No evidence of mass or hydronephrosis.  Abdominal Aorta:  No aneurysm identified.  IMPRESSION:  1.  No acute findings. 2.  Previously described left renal cyst and indeterminate right renal lesion is not well seen on the current examination.  Original Report Authenticated By: Rosealee Albee, M.D.     1. Abdominal pain, chronic, right upper quadrant       MDM  Patient was evaluated by myself. Based on evaluation patient had laboratory workup for possible abdominal processes that might cause her pain. CBC showed no anemia or leukocytosis. Complete metabolic panel demonstrated no renal compromise, which would  abnormalities, or liver dysfunction. Lipase was 55 today Mrs. not significantly changed from prior elevated values in the past. Urinalysis was nitrate negative with only 0-2 white blood cells. There was evidence of casts and calcium oxalate crystals but no hematuria today. Given location the patient's pain an abdominal ultrasound was performed. Patient had absolutely no gallbladder pathology. Remainder of the abdomen also was benign. As patient is a chronic smoker and her pain is located lower on the right chest wall and upper abdominal wall I did perform a chest x-ray. This showed no signs of mass or other findings to explain her pain. Patient did have obvious evidence of COPD. We discussed smoking cessation. Handout was provided for this. Patient was treated for pain with morphine and Zofran for nausea initially. She was given oral Percocet following return of her findings. Patient was able to tolerate this. She was discharged with a prescription for this and Zofran and advised to followup with her primary care physician. Patient was discharged in good condition and given precautions for reasons to return. The other diagnosis to consider an  evaluation the patient was the possibility of pulmonary embolus however patient was hemodynamically stable with no tachycardia no tachypnea no hypoxia and no variation in her symptoms at all with deep breathing and a relatively atypical location for this.        Cyndra Numbers, MD 12/29/11 [phone]

## 2011-12-29 NOTE — ED Notes (Signed)
Pt in xray at this time- updated family member on plan of care

## 2011-12-29 NOTE — ED Notes (Signed)
Dr. at bedside talking with pt. IV site flushed.

## 2011-12-31 ENCOUNTER — Emergency Department (HOSPITAL_BASED_OUTPATIENT_CLINIC_OR_DEPARTMENT_OTHER): Payer: Self-pay

## 2011-12-31 ENCOUNTER — Emergency Department (HOSPITAL_BASED_OUTPATIENT_CLINIC_OR_DEPARTMENT_OTHER)
Admission: EM | Admit: 2011-12-31 | Discharge: 2011-12-31 | Disposition: A | Discharge status: Home / Self Care | Payer: Self-pay | Attending: Emergency Medicine | Admitting: Emergency Medicine

## 2011-12-31 ENCOUNTER — Encounter (HOSPITAL_BASED_OUTPATIENT_CLINIC_OR_DEPARTMENT_OTHER): Payer: Self-pay | Admitting: *Deleted

## 2011-12-31 DIAGNOSIS — R6883 Chills (without fever): Secondary | ICD-10-CM | POA: Insufficient documentation

## 2011-12-31 DIAGNOSIS — M5134 Other intervertebral disc degeneration, thoracic region: Secondary | ICD-10-CM

## 2011-12-31 DIAGNOSIS — IMO0002 Reserved for concepts with insufficient information to code with codable children: Secondary | ICD-10-CM | POA: Insufficient documentation

## 2011-12-31 DIAGNOSIS — F329 Major depressive disorder, single episode, unspecified: Secondary | ICD-10-CM | POA: Insufficient documentation

## 2011-12-31 DIAGNOSIS — F3289 Other specified depressive episodes: Secondary | ICD-10-CM | POA: Insufficient documentation

## 2011-12-31 DIAGNOSIS — J449 Chronic obstructive pulmonary disease, unspecified: Secondary | ICD-10-CM

## 2011-12-31 DIAGNOSIS — R1011 Right upper quadrant pain: Secondary | ICD-10-CM | POA: Insufficient documentation

## 2011-12-31 DIAGNOSIS — I10 Essential (primary) hypertension: Secondary | ICD-10-CM | POA: Insufficient documentation

## 2011-12-31 DIAGNOSIS — J4489 Other specified chronic obstructive pulmonary disease: Secondary | ICD-10-CM | POA: Insufficient documentation

## 2011-12-31 DIAGNOSIS — M549 Dorsalgia, unspecified: Secondary | ICD-10-CM | POA: Insufficient documentation

## 2011-12-31 DIAGNOSIS — Z79899 Other long term (current) drug therapy: Secondary | ICD-10-CM | POA: Insufficient documentation

## 2011-12-31 HISTORY — DX: Calculus of kidney: N20.0

## 2011-12-31 HISTORY — DX: Edema, unspecified: R60.9

## 2011-12-31 LAB — CBC WITH DIFFERENTIAL/PLATELET
Basophils Absolute: 0 10*3/uL (ref 0.0–0.1)
Basophils Relative: 0 % (ref 0–1)
MCHC: 34.7 g/dL (ref 30.0–36.0)
Neutro Abs: 3.3 10*3/uL (ref 1.7–7.7)
Neutrophils Relative %: 68 % (ref 43–77)
Platelets: 127 10*3/uL — ABNORMAL LOW (ref 150–400)
RDW: 12 % (ref 11.5–15.5)

## 2011-12-31 LAB — COMPREHENSIVE METABOLIC PANEL
AST: 24 U/L (ref 0–37)
Albumin: 4.1 g/dL (ref 3.5–5.2)
Alkaline Phosphatase: 37 U/L — ABNORMAL LOW (ref 39–117)
Chloride: 107 mEq/L (ref 96–112)
Potassium: 4.5 mEq/L (ref 3.5–5.1)
Total Bilirubin: 0.3 mg/dL (ref 0.3–1.2)
Total Protein: 6.7 g/dL (ref 6.0–8.3)

## 2011-12-31 LAB — URINALYSIS, ROUTINE W REFLEX MICROSCOPIC
Bilirubin Urine: NEGATIVE
Glucose, UA: NEGATIVE mg/dL
Hgb urine dipstick: NEGATIVE
Ketones, ur: NEGATIVE mg/dL
Protein, ur: NEGATIVE mg/dL

## 2011-12-31 MED ORDER — PREDNISONE 10 MG PO TABS: 10.0000 mg | ORAL_TABLET | Freq: Every day | ORAL | Status: DC

## 2011-12-31 MED ORDER — HYDROMORPHONE HCL PF 1 MG/ML IJ SOLN
1.0000 mg | Freq: Once | INTRAMUSCULAR | Status: AC
  Administered 2011-12-31: 1 mg via INTRAVENOUS
  Filled 2011-12-31: qty 1

## 2011-12-31 MED ORDER — SODIUM CHLORIDE 0.9 % IV SOLN
Freq: Once | INTRAVENOUS | Status: AC
  Administered 2011-12-31: 13:00:00 via INTRAVENOUS

## 2011-12-31 MED ORDER — IOHEXOL 350 MG/ML SOLN
80.0000 mL | Freq: Once | INTRAVENOUS | Status: AC | PRN
  Administered 2011-12-31: 80 mL via INTRAVENOUS

## 2011-12-31 MED ORDER — HYDROCODONE-ACETAMINOPHEN 5-325 MG PO TABS: 2.0000 | ORAL_TABLET | ORAL | Status: AC | PRN

## 2011-12-31 MED ORDER — ONDANSETRON HCL 4 MG/2ML IJ SOLN
4.0000 mg | Freq: Once | INTRAMUSCULAR | Status: AC
  Administered 2011-12-31: 4 mg via INTRAVENOUS
  Filled 2011-12-31: qty 2

## 2011-12-31 NOTE — ED Provider Notes (Signed)
History     CSN: [id number]  Arrival date & time 12/31/11  1123   First MD Initiated Contact with Patient 12/31/11 1216      Chief Complaint  Patient presents with  . Abdominal Pain    (Consider location/radiation/quality/duration/timing/severity/associated sxs/prior treatment) Patient is a 56 y.o. female presenting with abdominal pain. The history is provided by the patient. No language interpreter was used.  Abdominal Pain The primary symptoms of the illness include abdominal pain. Episode onset: 3 days. The onset of the illness was gradual. The problem has been gradually worsening.  Additional symptoms associated with the illness include chills and back pain.  Pt has copd.  Pt complains of pain in the right upper abdomen.   Pt reports pain with taking a deep breath.  Pain not reproducible on abd exam seen to be more in the chest  Past Medical History  Diagnosis Date  . Hypertension   . Depression   . Anxiety   . Kidney stone   . Edema     lower extremities up to her knee    Past Surgical History  Procedure Date  . Abdominal hysterectomy   . Knee surgery   . Colon surgery   . Abdominal surgery     No family history on file.  History  Substance Use Topics  . Smoking status: Current Everyday Smoker -- 1.0 packs/day for 41 years    Types: Cigarettes  . Smokeless tobacco: Not on file  . Alcohol Use: No     quit drinking Feb 10 , 2013    OB History    Grav Para Term Preterm Abortions TAB SAB Ect Mult Living                  Review of Systems  Constitutional: Positive for chills.  Gastrointestinal: Positive for abdominal pain.  Musculoskeletal: Positive for back pain.  All other systems reviewed and are negative.    Allergies  Penicillins  Home Medications   Current Outpatient Rx  Name Route Sig Dispense Refill  . ALBUTEROL SULFATE HFA 108 (90 BASE) MCG/ACT IN AERS Inhalation Inhale into the lungs every 6 (six) hours as needed. Patient uses this  medication for shortness of breath.    . ALPRAZOLAM 1 MG PO TABS Oral Take 1 mg by mouth at bedtime.    Marland Kitchen BEE POLLEN PO Oral Take 2 capsules by mouth daily.     . IBUPROFEN 200 MG PO TABS Oral Take 800 mg by mouth 2 (two) times daily as needed. For pain    . ONDANSETRON HCL 4 MG PO TABS Oral Take 1 tablet (4 mg total) by mouth every 6 (six) hours. 12 tablet 0  . OXYCODONE-ACETAMINOPHEN 5-325 MG PO TABS  Take 1-2 tabs by mouth every 6 hours as needed for pain. 30 tablet 0  . SERTRALINE HCL 100 MG PO TABS Oral Take 50 mg by mouth daily.       BP 160/89  Pulse 65  Temp 98.2 F (36.8 C) (Oral)  Resp 20  SpO2 100%  Physical Exam  Nursing note and vitals reviewed. Constitutional: She is oriented to person, place, and time. She appears well-developed and well-nourished.  HENT:  Head: Normocephalic and atraumatic.  Right Ear: External ear normal.  Left Ear: External ear normal.  Nose: Nose normal.  Mouth/Throat: Oropharynx is clear and moist.  Eyes: Conjunctivae are normal.  Neck: Normal range of motion. Neck supple.  Cardiovascular: Normal rate and regular rhythm.  Pulmonary/Chest: Effort normal.       Coarse rhonchi  Abdominal: Soft.  Musculoskeletal: Normal range of motion.  Neurological: She is alert and oriented to person, place, and time. She has normal reflexes.  Skin: Skin is warm.  Psychiatric: She has a normal mood and affect.    ED Course  Procedures (including critical care time)  Labs Reviewed  CBC WITH DIFFERENTIAL - Abnormal; Notable for the following:    Platelets 127 (*)     All other components within normal limits  COMPREHENSIVE METABOLIC PANEL - Abnormal; Notable for the following:    Glucose, Bld 101 (*)     Alkaline Phosphatase 37 (*)     All other components within normal limits  URINALYSIS, ROUTINE W REFLEX MICROSCOPIC   Dg Chest 2 View  12/29/2011  *RADIOLOGY REPORT*  Clinical Data: Right-sided chest pain.  COPD.  CHEST - 2 VIEW  Comparison:  10/24/2011  Findings: Artifact overlies the chest.  Heart size is normal. Mediastinal shadows are normal.  The lungs are hyperinflated with increased interstitial markings.  No sign of infiltrate, mass, effusion or collapse.  No significant bony finding.  IMPRESSION: Chronic obstructive lung disease.  Areas of scarring but no sign of active process.  No cause of right-sided pain identified.  Original Report Authenticated By: Thomasenia Sales, M.D.   Ct Angio Chest W/cm &/or Wo Cm  12/31/2011  *RADIOLOGY REPORT*  Clinical Data: Right lower chest pain radiating into the right upper back over the past 3 days.  Bilateral lower extremity (pedal) edema.  CT ANGIOGRAPHY CHEST  Technique:  Multidetector CT imaging of the chest using the standard protocol during bolus administration of intravenous contrast. Multiplanar reconstructed images including MIPs were obtained and reviewed to evaluate the vascular anatomy.  Contrast: 80mL OMNIPAQUE IOHEXOL 350 MG/ML SOLN  Comparison: None.  Findings: Contrast opacification of pulmonary arteries is very good.  Respiratory motion blurred images throughout the examination.  Overall, the study is of good diagnostic quality.  No filling defects within either main pulmonary artery or their branches in either lung to suggest pulmonary embolism.  Heart size normal.  No pericardial effusion.  No visible coronary atherosclerosis.  Mild atherosclerosis involving the thoracic and upper abdominal aorta without aneurysm or dissection.  Proximal great vessels widely patent.  Severe bullous emphysematous changes throughout both lungs. Largest blebs are medially in the left upper lobe. Pleuroparenchymal scarring in the lung apices.  Pulmonary parenchyma otherwise clear without localized airspace consolidation, interstitial disease, or parenchymal nodules or masses.  Central airways patent with severe bronchial wall thickening.  No significant hilar, mediastinal, or axillary lymphadenopathy.  Left-sided SVC noted, as the left innominate vein courses along the left side of the mediastinum and drains into the coronary sinus. Visualized thyroid gland unremarkable.  Visualized extreme upper abdomen unremarkable for the early arterial phase enhancement. Bone window images demonstrate osteopenia and mild upper and mid thoracic spondylosis.  IMPRESSION:  1.  No evidence of pulmonary embolism. 2.  Severe COPD/emphysema.  Severe central bronchial wall thickening consistent with bronchitis, either acute or chronic.  No acute cardiopulmonary disease otherwise. 3.  Left sided SVC incidentally noted.  Original Report Authenticated By: Arnell Sieving, M.D.   US Abdomen Complete  12/29/2011  *RADIOLOGY REPORT*  Clinical Data:  Abdominal pain  ABDOMINAL ULTRASOUND COMPLETE  Comparison:  CT from 02/06/2009  Findings:  Gallbladder:  No gallstones, gallbladder wall thickening, or pericholecystic fluid.  Common Bile Duct:  Within normal limits in caliber.  Liver: No focal mass lesion identified.  Within normal limits in parenchymal echogenicity.  IVC:  Appears normal.  Pancreas:  No abnormality identified.  Spleen:  Within normal limits in size and echotexture.  Right kidney:  Normal in size and parenchymal echogenicity.  No evidence of mass or hydronephrosis.  Left kidney:  Normal in size and parenchymal echogenicity.  No evidence of mass or hydronephrosis.  Abdominal Aorta:  No aneurysm identified.  IMPRESSION:  1.  No acute findings. 2.  Previously described left renal cyst and indeterminate right renal lesion is not well seen on the current examination.  Original Report Authenticated By: Rosealee Albee, M.D.     1. COPD (chronic obstructive pulmonary disease)   2. Degenerative disc disease, thoracic       MDM  Pt given Iv dilaudid and zofran.   Labs ua is negative,  No acute lab abnormality,   I scanned pt's chest to rule out pe.  Pt does have degenerative changes to lumbar vertebrae and severe copd.    I will treat with prednisone and hydrocodone.   I advised pt to see her Md for recheck in 3-4 days.        Lonia Skinner [place], [place] 12/31/11 1432  Lonia Skinner [place], [place] 12/31/11 1432

## 2011-12-31 NOTE — ED Notes (Signed)
Patient states she has has left upper abdominal pain radiating into her left upper back for the last 3 days.  Was seen here.  States she woke up on Monday with bilateral feet swelling.  States swelling is improved some, continues to have pain and nausea.

## 2012-01-06 NOTE — ED Provider Notes (Signed)
Medical screening examination/treatment/procedure(s) were performed by non-physician practitioner and as supervising physician I was immediately available for consultation/collaboration.  Geoffery Lyons, MD 01/06/12 [phone]

## 2012-02-13 ENCOUNTER — Encounter: Payer: Self-pay | Admitting: *Deleted

## 2012-02-14 ENCOUNTER — Institutional Professional Consult (permissible substitution): Payer: Self-pay | Admitting: Internal Medicine
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
## 2012-03-31 ENCOUNTER — Encounter: Payer: Self-pay | Admitting: Pulmonary Disease

## 2012-03-31 ENCOUNTER — Ambulatory Visit (INDEPENDENT_AMBULATORY_CARE_PROVIDER_SITE_OTHER): Payer: Self-pay | Admitting: Pulmonary Disease

## 2012-03-31 VITALS — BP 116/66 | HR 85 | Temp 98.0°F | Ht 66.0 in | Wt 106.0 lb

## 2012-03-31 DIAGNOSIS — F17200 Nicotine dependence, unspecified, uncomplicated: Secondary | ICD-10-CM

## 2012-03-31 DIAGNOSIS — J449 Chronic obstructive pulmonary disease, unspecified: Secondary | ICD-10-CM

## 2012-03-31 DIAGNOSIS — Z720 Tobacco use: Secondary | ICD-10-CM

## 2012-03-31 MED ORDER — PREDNISONE 10 MG PO TABS: ORAL_TABLET | ORAL | Status: DC

## 2012-03-31 NOTE — Progress Notes (Signed)
Subjective:    Patient ID: Annette Palmer, female    DOB: 04-12-1956, 56 y.o.   MRN: [id number]  HPI PCP - Kip COrrington  56 y.o 1 pPD smoker presents for evaluation of COPD. She presented to the ED for right UQ pain, underwent US which showed nml gall bladder & left renal cyst. CT angiogram was then performed which did not show any filling defects. It showed Severe bullous emphysematous changes throughout both lungs with the largest blebs are medially in the left upper lobe. Pleuroparenchymal scarring was noted in the lung apices. Incidentally, Left-sided SVC was  Noted. She was prescribed spiriva & oxygen ,both of which she could not afford. Albuterol MDI does help - technique poor. She has an electronic cigarette , but does not like the flavors they have available. She has been able to cut down to 1 PPD, reports dyspnea on exertion & cough with minimal white sputum. She lives [place] her mother's farm with horses & chickens & is divorced. Codeine cough syrup helped with her cough She denies wt loss or hemoptysis. She admits to depression due to recent death of her daughter. Spirometry showed Moderately severe obstruction with FEv1 53%  Past Medical History  Diagnosis Date  . Hypertension   . Depression   . Anxiety   . Kidney stone   . Edema     lower extremities up to her knee  . Hemarthrosis involving knee joint   . Joint pain   . Nausea   . Chest pain   . Hemoptysis   . COPD (chronic obstructive pulmonary disease)    Past Surgical History  Procedure Date  . Abdominal hysterectomy 2010  . Knee surgery     left  . Colon surgery 2011  . Appendectomy 2010  . Tubal ligation    Allergies  Allergen Reactions  . Penicillins Hives and Swelling  . Sulfa Drugs Cross Reactors    History   Social History  . Marital Status: Divorced    Spouse Name: N/A    Number of Children: N/A  . Years of Education: N/A   Occupational History  . Not on file.   Social History Main Topics    . Smoking status: Current Every Day Smoker -- 1.0 packs/day    Types: Cigarettes  . Smokeless tobacco: Never Used   Comment: Start smoking at age 50.  Currently smoking 1 ppd.    . Alcohol Use: No     quit drinking Feb 10 , 2013  . Drug Use: No  . Sexually Active: Not on file   Other Topics Concern  . Not on file   Social History Narrative  . No narrative on file     Review of Systems  Constitutional: Negative for fever and unexpected weight change.  HENT: Positive for rhinorrhea. Negative for ear pain, nosebleeds, congestion, sore throat, sneezing, trouble swallowing, dental problem, postnasal drip and sinus pressure.   Eyes: Negative for redness and itching.  Respiratory: Positive for cough, chest tightness, shortness of breath and wheezing.   Cardiovascular: Positive for chest pain and palpitations. Negative for leg swelling.  Gastrointestinal: Positive for nausea. Negative for vomiting.  Genitourinary: Negative for dysuria.  Musculoskeletal: Negative for joint swelling.  Skin: Negative for rash.  Neurological: Negative for headaches.  Hematological: Does not bruise/bleed easily.  Psychiatric/Behavioral: Positive for dysphoric mood. The patient is not nervous/anxious.        Objective:   Physical Exam  Gen. Pleasant, thin woman, in no distress, normal  affect ENT - no lesions, no post nasal drip Neck: No JVD, no thyromegaly, no carotid bruits Lungs: barrel chest, no use of accessory muscles, no dullness to percussion, decreased without rales or rhonchi  Cardiovascular: Rhythm regular, heart sounds  normal, no murmurs or gallops, no peripheral edema Abdomen: soft and non-tender, no hepatosplenomegaly, BS normal. Musculoskeletal: No deformities, no cyanosis or clubbing Neuro:  alert, non focal        Assessment & Plan:

## 2012-03-31 NOTE — Patient Instructions (Addendum)
You have COPD due to smoking -lung capacity is only at 50% You have to make an attempt to quit Take DELSYM 2 tsp thrice daily for cough Albuterol 2 puffs every 6h as needed for cough Trial of symbicort 160 2 puffs twice daily for cough (sample) - RINSE mouth after use Prednisone 10 mg tabs - Take 4 tabs  daily with food x 4 days, then 3 tabs daily x 4 days, then 2 tabs daily x 4 days, then 1 tab daily x4 days then stop. #40 Try to get insurance witht he new program that has been anounced

## 2012-03-31 NOTE — Assessment & Plan Note (Signed)
Cessation would be most important intervention Used spirometry to motivate her to quit. She will try marlboro flavor in her electronic cigarette Hesitant to use chantix due to depression

## 2012-03-31 NOTE — Assessment & Plan Note (Signed)
You have COPD due to smoking You have to make an attempt to quit Take DELSYM 2 tsp thrice daily for cough Albuterol 2 puffs every 6h as needed for cough Trial of symbicort 160 2 puffs twice daily for cough (sample) - RINSE mouth after use Prednisone 10 mg tabs - Take 4 tabs  daily with food x 4 days, then 3 tabs daily x 4 days, then 2 tabs daily x 4 days, then 1 tab daily x4 days then stop. #40  Try to get insurance witht he new program that has been anounced

## 2012-04-15 ENCOUNTER — Telehealth: Payer: Self-pay | Admitting: Pulmonary Disease

## 2012-04-15 MED ORDER — PROMETHAZINE-CODEINE 6.25-10 MG/5ML PO SYRP: 5.0000 mL | ORAL_SOLUTION | Freq: Two times a day (BID) | ORAL | Status: DC | PRN

## 2012-04-15 NOTE — Telephone Encounter (Signed)
Pt informed of Dr Reginia Naas instructions and that rx was sent to pharmacy.

## 2012-04-15 NOTE — Telephone Encounter (Signed)
Pt c/o cough no better.  Unable to bring anything up but has lots of chest congestion.   Delsym is not working.  Pt is 2 days left on Pred taper, is using Symbicort bid and Albuterol inh prn.  Pt would like something stronger called in for cough.  Please advise. Allergies  Allergen Reactions  . Penicillins Hives and Swelling  . Sulfa Drugs Cross Reactors

## 2012-04-15 NOTE — Telephone Encounter (Signed)
Promethazine codeine 5ml PO bid prn x 10 days No driving while on this Take 1/2 dose if this makes her sleepy

## 2012-04-29 ENCOUNTER — Telehealth: Payer: Self-pay | Admitting: Pulmonary Disease

## 2012-04-29 NOTE — Telephone Encounter (Signed)
Pt c/o prod cough (yellow, brown). Left rib pain. SOB.  Denies fever.  Pt given appt with Tammy Parrett 04-30-12 at 9:00.

## 2012-04-30 ENCOUNTER — Telehealth: Payer: Self-pay | Admitting: Pulmonary Disease

## 2012-04-30 ENCOUNTER — Ambulatory Visit: Payer: Self-pay | Admitting: Adult Health

## 2012-04-30 NOTE — Telephone Encounter (Signed)
Pt returned call.  Pt not expecting a returned call until tomorrow.  Annette Palmer

## 2012-04-30 NOTE — Telephone Encounter (Signed)
ATC, no answer. LMOMTCB 

## 2012-04-30 NOTE — Telephone Encounter (Signed)
Spoke with patient, she reports still having prod cough with clear mucus that now has black spots in it. Patient has finished pred taper and promethezine-codeine cough syrup. Patient requesting another bottle of promethezine-codeine. Dr. Vassie Loll do you want to refill? Please advise  Last ordered: 04/15/2012 Promethazine codeine 5ml PO bid prn x 10 days

## 2012-04-30 NOTE — Telephone Encounter (Signed)
Ok to refill x 1 Has she quit smoking ? COugh will not stop otherwise

## 2012-05-01 MED ORDER — PROMETHAZINE-CODEINE 6.25-10 MG/5ML PO SYRP: 5.0000 mL | ORAL_SOLUTION | Freq: Two times a day (BID) | ORAL | Status: DC | PRN

## 2012-05-01 NOTE — Telephone Encounter (Signed)
Rx has been called in. Pt is aware. 

## 2012-05-01 NOTE — Telephone Encounter (Signed)
Pt called back again. Annette Palmer °

## 2012-05-05 ENCOUNTER — Ambulatory Visit: Payer: Self-pay | Admitting: Adult Health

## 2012-08-16 ENCOUNTER — Emergency Department (HOSPITAL_BASED_OUTPATIENT_CLINIC_OR_DEPARTMENT_OTHER): Payer: Self-pay

## 2012-08-16 ENCOUNTER — Encounter (HOSPITAL_BASED_OUTPATIENT_CLINIC_OR_DEPARTMENT_OTHER): Payer: Self-pay | Admitting: *Deleted

## 2012-08-16 ENCOUNTER — Emergency Department (HOSPITAL_BASED_OUTPATIENT_CLINIC_OR_DEPARTMENT_OTHER)
Admission: EM | Admit: 2012-08-16 | Discharge: 2012-08-16 | Disposition: A | Discharge status: Home / Self Care | Payer: Self-pay | Attending: Emergency Medicine | Admitting: Emergency Medicine

## 2012-08-16 DIAGNOSIS — R110 Nausea: Secondary | ICD-10-CM | POA: Insufficient documentation

## 2012-08-16 DIAGNOSIS — R5381 Other malaise: Secondary | ICD-10-CM | POA: Insufficient documentation

## 2012-08-16 DIAGNOSIS — J4489 Other specified chronic obstructive pulmonary disease: Secondary | ICD-10-CM | POA: Insufficient documentation

## 2012-08-16 DIAGNOSIS — R197 Diarrhea, unspecified: Secondary | ICD-10-CM | POA: Insufficient documentation

## 2012-08-16 DIAGNOSIS — E860 Dehydration: Secondary | ICD-10-CM | POA: Insufficient documentation

## 2012-08-16 DIAGNOSIS — Z87442 Personal history of urinary calculi: Secondary | ICD-10-CM | POA: Insufficient documentation

## 2012-08-16 DIAGNOSIS — I10 Essential (primary) hypertension: Secondary | ICD-10-CM | POA: Insufficient documentation

## 2012-08-16 DIAGNOSIS — J449 Chronic obstructive pulmonary disease, unspecified: Secondary | ICD-10-CM | POA: Insufficient documentation

## 2012-08-16 DIAGNOSIS — R1084 Generalized abdominal pain: Secondary | ICD-10-CM | POA: Insufficient documentation

## 2012-08-16 DIAGNOSIS — Z7982 Long term (current) use of aspirin: Secondary | ICD-10-CM | POA: Insufficient documentation

## 2012-08-16 DIAGNOSIS — Z9089 Acquired absence of other organs: Secondary | ICD-10-CM | POA: Insufficient documentation

## 2012-08-16 DIAGNOSIS — Z9851 Tubal ligation status: Secondary | ICD-10-CM | POA: Insufficient documentation

## 2012-08-16 DIAGNOSIS — R5383 Other fatigue: Secondary | ICD-10-CM | POA: Insufficient documentation

## 2012-08-16 DIAGNOSIS — Z9889 Other specified postprocedural states: Secondary | ICD-10-CM | POA: Insufficient documentation

## 2012-08-16 DIAGNOSIS — F17200 Nicotine dependence, unspecified, uncomplicated: Secondary | ICD-10-CM | POA: Insufficient documentation

## 2012-08-16 DIAGNOSIS — F329 Major depressive disorder, single episode, unspecified: Secondary | ICD-10-CM | POA: Insufficient documentation

## 2012-08-16 DIAGNOSIS — F3289 Other specified depressive episodes: Secondary | ICD-10-CM | POA: Insufficient documentation

## 2012-08-16 DIAGNOSIS — Z8679 Personal history of other diseases of the circulatory system: Secondary | ICD-10-CM | POA: Insufficient documentation

## 2012-08-16 DIAGNOSIS — Z90710 Acquired absence of both cervix and uterus: Secondary | ICD-10-CM | POA: Insufficient documentation

## 2012-08-16 DIAGNOSIS — Z8739 Personal history of other diseases of the musculoskeletal system and connective tissue: Secondary | ICD-10-CM | POA: Insufficient documentation

## 2012-08-16 LAB — CBC
HCT: 35.3 % — ABNORMAL LOW (ref 36.0–46.0)
Hemoglobin: 12.3 g/dL (ref 12.0–15.0)
MCV: 93.1 fL (ref 78.0–100.0)
RDW: 14.8 % (ref 11.5–15.5)
WBC: 4.8 10*3/uL (ref 4.0–10.5)

## 2012-08-16 LAB — COMPREHENSIVE METABOLIC PANEL
Albumin: 3.8 g/dL (ref 3.5–5.2)
Alkaline Phosphatase: 34 U/L — ABNORMAL LOW (ref 39–117)
BUN: 24 mg/dL — ABNORMAL HIGH (ref 6–23)
Chloride: 109 mEq/L (ref 96–112)
Creatinine, Ser: 0.8 mg/dL (ref 0.50–1.10)
GFR calc Af Amer: >90 mL/min (ref >90)
GFR calc non Af Amer: 82 mL/min — ABNORMAL LOW (ref >90)
Glucose, Bld: 98 mg/dL (ref 70–99)
Potassium: 4.2 mEq/L (ref 3.5–5.1)
Total Bilirubin: 0.2 mg/dL — ABNORMAL LOW (ref 0.3–1.2)

## 2012-08-16 LAB — URINALYSIS, ROUTINE W REFLEX MICROSCOPIC
Glucose, UA: NEGATIVE mg/dL
Hgb urine dipstick: NEGATIVE
Ketones, ur: 15 mg/dL — AB
Protein, ur: NEGATIVE mg/dL
pH: 5.5 (ref 5.0–8.0)

## 2012-08-16 LAB — LIPASE, BLOOD: Lipase: 41 U/L (ref 11–59)

## 2012-08-16 MED ORDER — SODIUM CHLORIDE 0.9 % IV BOLUS (SEPSIS)
1000.0000 mL | Freq: Once | INTRAVENOUS | Status: AC
  Administered 2012-08-16: 1000 mL via INTRAVENOUS

## 2012-08-16 MED ORDER — ONDANSETRON HCL 4 MG/2ML IJ SOLN
INTRAMUSCULAR | Status: AC
  Administered 2012-08-16: 4 mg via INTRAVENOUS
  Filled 2012-08-16: qty 2

## 2012-08-16 MED ORDER — IOHEXOL 300 MG/ML  SOLN
50.0000 mL | Freq: Once | INTRAMUSCULAR | Status: AC | PRN
  Administered 2012-08-16: 50 mL via ORAL

## 2012-08-16 MED ORDER — MORPHINE SULFATE 4 MG/ML IJ SOLN
4.0000 mg | Freq: Once | INTRAMUSCULAR | Status: AC
  Administered 2012-08-16: 4 mg via INTRAVENOUS
  Filled 2012-08-16: qty 1

## 2012-08-16 MED ORDER — IOHEXOL 300 MG/ML  SOLN
100.0000 mL | Freq: Once | INTRAMUSCULAR | Status: AC | PRN
  Administered 2012-08-16: 100 mL via INTRAVENOUS

## 2012-08-16 MED ORDER — ONDANSETRON HCL 4 MG/2ML IJ SOLN
4.0000 mg | Freq: Once | INTRAMUSCULAR | Status: AC
  Administered 2012-08-16: 4 mg via INTRAVENOUS

## 2012-08-16 NOTE — ED Provider Notes (Signed)
History  This chart was scribed for Ethelda Chick, MD by Shari Heritage, ED Scribe. The patient was seen in room MH07/MH07. Patient's care was started at 1633.   CSN: [id number]  Arrival date & time 08/16/12  1514   First MD Initiated Contact with Patient 08/16/12 1633      Chief Complaint  Patient presents with  . Diarrhea     Patient is a 57 y.o. female presenting with diarrhea. The history is provided by the patient. No language interpreter was used.  Diarrhea Quality:  Watery Severity:  Moderate Number of episodes:  5-6 per day Duration:  4 weeks Timing:  Constant Progression:  Unchanged Associated symptoms: abdominal pain   Associated symptoms: no fever and no vomiting      HPI Comments: Annette Palmer is a 57 y.o. female who presents to the Emergency Department complaining of persistent diarrhea and nausea onset 3-4 weeks ago. Patient says that diarrhea is watery. She reports 5-6 episodes per day. There is associated lower abdominal pain and generalized weakness. Patient denies vomiting, fever, dizziness, lightheadedness or syncope. She denies any contacts with similar symptoms. She has a history of HTN and depression. Patient states that she is not taking her BP medicines because she cannot afford them. Patient is allergic to penicillins.    Past Medical History  Diagnosis Date  . Hypertension   . Depression   . Anxiety   . Kidney stone   . Edema     lower extremities up to her knee  . Hemarthrosis involving knee joint   . Joint pain   . Nausea   . Chest pain   . Hemoptysis   . COPD (chronic obstructive pulmonary disease)     Past Surgical History  Procedure Laterality Date  . Abdominal hysterectomy  2010  . Knee surgery      left  . Colon surgery  2011  . Appendectomy  2010  . Tubal ligation      Family History  Problem Relation Age of Onset  . Diabetes type I Mother   . Hyperlipidemia    . Hypertension Mother     History  Substance Use  Topics  . Smoking status: Current Every Day Smoker -- 1.00 packs/day    Types: Cigarettes  . Smokeless tobacco: Never Used     Comment: Start smoking at age 41.  Currently smoking 1 ppd.    . Alcohol Use: No     Comment: quit drinking Feb 10 , 2013    OB History   Grav Para Term Preterm Abortions TAB SAB Ect Mult Living                  Review of Systems  Constitutional: Negative for fever.  Gastrointestinal: Positive for nausea, abdominal pain and diarrhea. Negative for vomiting.  Neurological: Negative for dizziness, syncope and light-headedness.  All other systems reviewed and are negative.    Allergies  Penicillins and Sulfa drugs cross reactors  Home Medications   Current Outpatient Rx  Name  Route  Sig  Dispense  Refill  . albuterol (PROVENTIL HFA;VENTOLIN HFA) 108 (90 BASE) MCG/ACT inhaler   Inhalation   Inhale into the lungs every 6 (six) hours as needed. Patient uses this medication for shortness of breath.         . ALPRAZolam (XANAX) 1 MG tablet   Oral   Take 1 mg by mouth at bedtime.         . Aspirin-Salicylamide-Caffeine (BC HEADACHE  POWDER PO)   Oral   Take by mouth as needed. For HA         . BEE POLLEN PO   Oral   Take 2 capsules by mouth daily.          . clonazePAM (KLONOPIN) 0.5 MG tablet   Oral   Take 1.5 mg by mouth as needed.         . Diphenhydramine-APAP, sleep, (GOODY PM PO)   Oral   Take by mouth at bedtime.         Marland Kitchen ibuprofen (ADVIL,MOTRIN) 200 MG tablet   Oral   Take 800 mg by mouth 2 (two) times daily as needed. For pain         . predniSONE (DELTASONE) 10 MG tablet      Take 4 tabs  daily with food x 4 days, then 3 tabs daily x 4 days, then 2 tabs daily x 4 days, then 1 tab daily x4 days then stop   40 tablet   0   . promethazine-codeine (PHENERGAN WITH CODEINE) 6.25-10 MG/5ML syrup   Oral   Take 5 mLs by mouth 2 (two) times daily as needed for cough.   200 mL   0   . sertraline (ZOLOFT) 100 MG  tablet   Oral   Take 50 mg by mouth daily.            Triage Vitals: BP 144/86  Pulse 84  Temp(Src) 98.2 F (36.8 C) (Oral)  Resp 20  Ht 5\' 6"  (1.676 m)  Wt 96 lb 9 oz (43.8 kg)  BMI 15.59 kg/m2  SpO2 100%  Physical Exam  Nursing note and vitals reviewed. Constitutional: She is oriented to person, place, and time. She appears well-developed and well-nourished.  HENT:  Head: Normocephalic and atraumatic.  Mouth/Throat: Mucous membranes are dry.  Eyes: Conjunctivae and EOM are normal. Pupils are equal, round, and reactive to light.  Neck: Normal range of motion. Neck supple.  Cardiovascular: Normal rate, regular rhythm and normal heart sounds.   Pulmonary/Chest: Effort normal and breath sounds normal.  Abdominal: Soft. Bowel sounds are normal. There is tenderness.  Mild diffuse tenderness.   Musculoskeletal: Normal range of motion.  Neurological: She is alert and oriented to person, place, and time.  Skin: Skin is warm and dry.  Psychiatric: She has a normal mood and affect.    ED Course  Procedures (including critical care time) DIAGNOSTIC STUDIES: Oxygen Saturation is 100% on room air, normal by my interpretation.    COORDINATION OF CARE: 4:56 PM- Patient informed of current plan for treatment and evaluation and agrees with plan at this time.    Labs Reviewed  URINALYSIS, ROUTINE W REFLEX MICROSCOPIC - Abnormal; Notable for the following:    Ketones, ur 15 (*)    All other components within normal limits  CBC - Abnormal; Notable for the following:    RBC 3.79 (*)    HCT 35.3 (*)    All other components within normal limits  COMPREHENSIVE METABOLIC PANEL - Abnormal; Notable for the following:    BUN 24 (*)    Alkaline Phosphatase 34 (*)    Total Bilirubin 0.2 (*)    GFR calc non Af Amer 82 (*)    All other components within normal limits  STOOL CULTURE  CLOSTRIDIUM DIFFICILE BY PCR  LIPASE, BLOOD    No results found.   1. Diarrhea       MDM  Pt  presenting  with c/o diarrhea over the past several weeks.  She also c/o nausea, but no vomiting.  Also c/o lower abdominal pain.  Mild tenderness to palpation.  No fever/chills.  Pt was rehydrated, labs obtained as well as CT scan of abdomen.  No acute findings to explain her symptoms.  Stool cultures and cdiff ordered- she was unable to provide specimen in the ED, given specimen container to bring back to lab.  Discharged with strict return precautions.  Pt agreeable with plan.    I personally performed the services described in this documentation, which was scribed in my presence. The recorded information has been reviewed and is accurate.    Ethelda Chick, MD 08/19/12 [phone]

## 2012-08-16 NOTE — ED Notes (Signed)
MD at bedside. 

## 2012-08-16 NOTE — ED Notes (Signed)
Requested urine.  Pt sts she is unable to void.  Pt aware that sample is needed.

## 2012-08-16 NOTE — ED Notes (Signed)
Pt states she has had diarrhea x 3-4 weeks and feels weak and dehydrated. +nausea. No vomiting.

## 2013-12-28 ENCOUNTER — Encounter (HOSPITAL_BASED_OUTPATIENT_CLINIC_OR_DEPARTMENT_OTHER): Payer: Self-pay | Admitting: Emergency Medicine

## 2013-12-28 ENCOUNTER — Emergency Department (HOSPITAL_BASED_OUTPATIENT_CLINIC_OR_DEPARTMENT_OTHER): Payer: Self-pay

## 2013-12-28 ENCOUNTER — Emergency Department (HOSPITAL_BASED_OUTPATIENT_CLINIC_OR_DEPARTMENT_OTHER)
Admission: EM | Admit: 2013-12-28 | Discharge: 2013-12-28 | Disposition: A | Discharge status: Home / Self Care | Payer: Self-pay | Attending: Emergency Medicine | Admitting: Emergency Medicine

## 2013-12-28 DIAGNOSIS — F411 Generalized anxiety disorder: Secondary | ICD-10-CM | POA: Insufficient documentation

## 2013-12-28 DIAGNOSIS — Z87442 Personal history of urinary calculi: Secondary | ICD-10-CM | POA: Insufficient documentation

## 2013-12-28 DIAGNOSIS — F17200 Nicotine dependence, unspecified, uncomplicated: Secondary | ICD-10-CM | POA: Insufficient documentation

## 2013-12-28 DIAGNOSIS — R109 Unspecified abdominal pain: Secondary | ICD-10-CM | POA: Insufficient documentation

## 2013-12-28 DIAGNOSIS — R059 Cough, unspecified: Secondary | ICD-10-CM | POA: Insufficient documentation

## 2013-12-28 DIAGNOSIS — F3289 Other specified depressive episodes: Secondary | ICD-10-CM | POA: Insufficient documentation

## 2013-12-28 DIAGNOSIS — Z880 Allergy status to penicillin: Secondary | ICD-10-CM | POA: Insufficient documentation

## 2013-12-28 DIAGNOSIS — Z79899 Other long term (current) drug therapy: Secondary | ICD-10-CM | POA: Insufficient documentation

## 2013-12-28 DIAGNOSIS — R05 Cough: Secondary | ICD-10-CM | POA: Insufficient documentation

## 2013-12-28 DIAGNOSIS — I10 Essential (primary) hypertension: Secondary | ICD-10-CM | POA: Insufficient documentation

## 2013-12-28 DIAGNOSIS — F329 Major depressive disorder, single episode, unspecified: Secondary | ICD-10-CM | POA: Insufficient documentation

## 2013-12-28 DIAGNOSIS — J441 Chronic obstructive pulmonary disease with (acute) exacerbation: Secondary | ICD-10-CM | POA: Insufficient documentation

## 2013-12-28 DIAGNOSIS — R091 Pleurisy: Secondary | ICD-10-CM | POA: Insufficient documentation

## 2013-12-28 DIAGNOSIS — IMO0002 Reserved for concepts with insufficient information to code with codable children: Secondary | ICD-10-CM | POA: Insufficient documentation

## 2013-12-28 LAB — COMPREHENSIVE METABOLIC PANEL
ALBUMIN: 4.4 g/dL (ref 3.5–5.2)
ALK PHOS: 50 U/L (ref 39–117)
ALT: 53 U/L — AB (ref 0–35)
AST: 53 U/L — ABNORMAL HIGH (ref 0–37)
BUN: 18 mg/dL (ref 6–23)
CO2: 27 mEq/L (ref 19–32)
Calcium: 10 mg/dL (ref 8.4–10.5)
Chloride: 104 mEq/L (ref 96–112)
Creatinine, Ser: 0.7 mg/dL (ref 0.50–1.10)
GFR calc Af Amer: >90 mL/min (ref >90)
GFR calc non Af Amer: >90 mL/min (ref >90)
Glucose, Bld: 106 mg/dL — ABNORMAL HIGH (ref 70–99)
POTASSIUM: 4.7 meq/L (ref 3.7–5.3)
Sodium: 144 mEq/L (ref 137–147)
TOTAL PROTEIN: 7.5 g/dL (ref 6.0–8.3)
Total Bilirubin: 0.4 mg/dL (ref 0.3–1.2)

## 2013-12-28 LAB — CBC WITH DIFFERENTIAL/PLATELET
BASOS PCT: 0 % (ref 0–1)
Basophils Absolute: 0 10*3/uL (ref 0.0–0.1)
Eosinophils Absolute: 0.1 10*3/uL (ref 0.0–0.7)
Eosinophils Relative: 3 % (ref 0–5)
HCT: 42 % (ref 36.0–46.0)
HEMOGLOBIN: 14.5 g/dL (ref 12.0–15.0)
LYMPHS ABS: 1.7 10*3/uL (ref 0.7–4.0)
Lymphocytes Relative: 34 % (ref 12–46)
MCH: 32.4 pg (ref 26.0–34.0)
MCHC: 34.5 g/dL (ref 30.0–36.0)
MCV: 93.8 fL (ref 78.0–100.0)
MONOS PCT: 10 % (ref 3–12)
Monocytes Absolute: 0.5 10*3/uL (ref 0.1–1.0)
NEUTROS ABS: 2.6 10*3/uL (ref 1.7–7.7)
NEUTROS PCT: 53 % (ref 43–77)
Platelets: 136 10*3/uL — ABNORMAL LOW (ref 150–400)
RBC: 4.48 MIL/uL (ref 3.87–5.11)
RDW: 13.4 % (ref 11.5–15.5)
WBC: 4.9 10*3/uL (ref 4.0–10.5)

## 2013-12-28 LAB — LIPASE, BLOOD: Lipase: 52 U/L (ref 11–59)

## 2013-12-28 LAB — TROPONIN I: Troponin I: <0.3 ng/mL (ref <0.30)

## 2013-12-28 MED ORDER — IPRATROPIUM-ALBUTEROL 0.5-2.5 (3) MG/3ML IN SOLN
3.0000 mL | RESPIRATORY_TRACT | Status: DC
  Administered 2013-12-28: 3 mL via RESPIRATORY_TRACT
  Filled 2013-12-28: qty 3

## 2013-12-28 MED ORDER — ALBUTEROL SULFATE (2.5 MG/3ML) 0.083% IN NEBU
2.5000 mg | INHALATION_SOLUTION | Freq: Once | RESPIRATORY_TRACT | Status: AC
  Administered 2013-12-28: 2.5 mg via RESPIRATORY_TRACT
  Filled 2013-12-28: qty 3

## 2013-12-28 MED ORDER — CIPROFLOXACIN HCL 500 MG PO TABS: 500.0000 mg | ORAL_TABLET | Freq: Two times a day (BID) | ORAL | Status: DC

## 2013-12-28 MED ORDER — HYDROMORPHONE HCL PF 1 MG/ML IJ SOLN
1.0000 mg | Freq: Once | INTRAMUSCULAR | Status: AC
  Administered 2013-12-28: 1 mg via INTRAVENOUS
  Filled 2013-12-28: qty 1

## 2013-12-28 MED ORDER — PREDNISONE 20 MG PO TABS: 20.0000 mg | ORAL_TABLET | Freq: Every day | ORAL | Status: DC

## 2013-12-28 MED ORDER — PROMETHAZINE HCL 25 MG/ML IJ SOLN
25.0000 mg | Freq: Once | INTRAMUSCULAR | Status: AC
  Administered 2013-12-28: 25 mg via INTRAVENOUS
  Filled 2013-12-28: qty 1

## 2013-12-28 NOTE — ED Provider Notes (Signed)
CSN: [id number]     Arrival date & time 12/28/13  1529 History   First MD Initiated Contact with Patient 12/28/13 1533     Chief Complaint  Patient presents with  . Chest Pain     (Consider location/radiation/quality/duration/timing/severity/associated sxs/prior Treatment) HPI Comments: Patient presents to the ER for evaluation of right upper abdominal wall and the right lower chest pain. Patient reports that the symptoms began earlier this morning. At the time that it began it was severe. Patient reports that she used her inhaler and the symptoms improved, but still present. She says she has severe lung disease, but cannot afford to go to her lung doctor. Her primary doctor did recently give her a cough medicine and albuterol which she has been using. Cough is mostly nonproductive, no fever.  Patient is a 58 y.o. female presenting with chest pain.  Chest Pain Associated symptoms: abdominal pain, cough and shortness of breath     Past Medical History  Diagnosis Date  . Hypertension   . Depression   . Anxiety   . Kidney stone   . Edema     lower extremities up to her knee  . Hemarthrosis involving knee joint   . Joint pain   . Nausea   . Chest pain   . Hemoptysis   . COPD (chronic obstructive pulmonary disease)    Past Surgical History  Procedure Laterality Date  . Abdominal hysterectomy  2010  . Knee surgery      left  . Colon surgery  2011  . Appendectomy  2010  . Tubal ligation     Family History  Problem Relation Age of Onset  . Diabetes type I Mother   . Hyperlipidemia    . Hypertension Mother    History  Substance Use Topics  . Smoking status: Current Every Day Smoker -- 1.00 packs/day    Types: Cigarettes  . Smokeless tobacco: Never Used     Comment: Start smoking at age 58.  Currently smoking 1 ppd.    . Alcohol Use: No     Comment: quit drinking Feb 10 , 2013   OB History   Grav Para Term Preterm Abortions TAB SAB Ect Mult Living                  Review of Systems  Respiratory: Positive for cough and shortness of breath.   Cardiovascular: Positive for chest pain.  Gastrointestinal: Positive for abdominal pain.  All other systems reviewed and are negative.     Allergies  Penicillins; Sulfa drugs cross reactors; and Zofran  Home Medications   Prior to Admission medications   Medication Sig Start Date End Date Taking? Authorizing Provider  albuterol (PROVENTIL HFA;VENTOLIN HFA) 108 (90 BASE) MCG/ACT inhaler Inhale into the lungs every 6 (six) hours as needed. Patient uses this medication for shortness of breath.    Historical Provider, MD  ALPRAZolam Prudy Feeler(XANAX) 1 MG tablet Take 1 mg by mouth at bedtime.    Historical Provider, MD  Aspirin-Salicylamide-Caffeine (BC HEADACHE POWDER PO) Take by mouth as needed. For HA    Historical Provider, MD  BEE POLLEN PO Take 2 capsules by mouth daily.     Historical Provider, MD  clonazePAM (KLONOPIN) 0.5 MG tablet Take 1.5 mg by mouth as needed.    Historical Provider, MD  Diphenhydramine-APAP, sleep, (GOODY PM PO) Take by mouth at bedtime.    Historical Provider, MD  ibuprofen (ADVIL,MOTRIN) 200 MG tablet Take 800 mg by mouth 2 (  two) times daily as needed. For pain    Historical Provider, MD  predniSONE (DELTASONE) 10 MG tablet Take 4 tabs  daily with food x 4 days, then 3 tabs daily x 4 days, then 2 tabs daily x 4 days, then 1 tab daily x4 days then stop 03/31/12   Oretha Milchakesh Alva V, MD  promethazine-codeine (PHENERGAN WITH CODEINE) 6.25-10 MG/5ML syrup Take 5 mLs by mouth 2 (two) times daily as needed for cough. 05/01/12   Oretha Milchakesh Alva V, MD  sertraline (ZOLOFT) 100 MG tablet Take 50 mg by mouth daily.     Historical Provider, MD   BP 128/80  Pulse 87  Temp(Src) 98 F (36.7 C) (Oral)  Resp 20  SpO2 98% Physical Exam  Constitutional: She is oriented to person, place, and time. She appears well-developed and well-nourished. No distress.  HENT:  Head: Normocephalic and atraumatic.  Right Ear:  Hearing normal.  Left Ear: Hearing normal.  Nose: Nose normal.  Mouth/Throat: Oropharynx is clear and moist and mucous membranes are normal.  Eyes: Conjunctivae and EOM are normal. Pupils are equal, round, and reactive to light.  Neck: Normal range of motion. Neck supple.  Cardiovascular: Regular rhythm, S1 normal and S2 normal.  Exam reveals no gallop and no friction rub.   No murmur heard. Pulmonary/Chest: Effort normal. No respiratory distress. She has decreased breath sounds. She exhibits no tenderness.  Abdominal: Soft. Normal appearance and bowel sounds are normal. There is no hepatosplenomegaly. There is no tenderness. There is no rebound, no guarding, no tenderness at McBurney's point and negative Murphy's sign. No hernia.  Musculoskeletal: Normal range of motion.  Neurological: She is alert and oriented to person, place, and time. She has normal strength. No cranial nerve deficit or sensory deficit. Coordination normal. GCS eye subscore is 4. GCS verbal subscore is 5. GCS motor subscore is 6.  Skin: Skin is warm, dry and intact. No rash noted. No cyanosis.  Psychiatric: She has a normal mood and affect. Her speech is normal and behavior is normal. Thought content normal.    ED Course  Procedures (including critical care time) Labs Review Labs Reviewed  CBC WITH DIFFERENTIAL - Abnormal; Notable for the following:    Platelets 136 (*)    All other components within normal limits  COMPREHENSIVE METABOLIC PANEL - Abnormal; Notable for the following:    Glucose, Bld 106 (*)    AST 53 (*)    ALT 53 (*)    All other components within normal limits  LIPASE, BLOOD  TROPONIN I    Imaging Review [place] Abdomen Complete  12/28/2013   CLINICAL DATA:  Right upper quadrant pain  EXAM: ULTRASOUND ABDOMEN COMPLETE  COMPARISON:  Acute abdominal series of today's date and CT scan of the abdomen and pelvis of August 16, 2012 and CT dated February 06, 2009.  FINDINGS: Gallbladder:  Gallbladder is  only partially distended. There is no gallbladder wall thickening,pericholecystic fluid, nor gallstones. There is no positive sonographic Murphy's sign.  Common bile duct:  Diameter: 2.1 mm  Liver:  No focal lesion identified. The echotexture is mildly increased likely secondary to fatty infiltration.  IVC:  No abnormality visualized.  Pancreas:  Visualized portion unremarkable.  Spleen:  Size and appearance within normal limits.  Right Kidney:  Length: 9.8 cm. Echogenicity within normal limits. No mass or hydronephrosis visualized.  Left Kidney:  Length: 9.9 cm. Echogenicity within normal limits. There are hypoechoic structures associated with the upper and lower poles most compatible  with a non simple cysts. One exophytic from the lower pole measures 1.1 cm in greatest dimension and is similar to that seen on the CT scan of February 2014.  Abdominal aorta:  No aneurysm visualized.  Other findings:  None.  IMPRESSION: 1. No gallstones are evident nor is there definite sonographic evidence of acute cholecystitis. The gallbladder is incompletely distended which may be due to recent ingestion of a meal. 2. Fatty infiltrative change of the liver. 3. Hypodensities in the left kidney likely reflect cysts. These have been intermittently demonstrated on the previous ultrasound and CT scans.   Electronically Signed   By: David  [place]   On: 12/28/2013 17:10   Dg Abd Acute W/chest  12/28/2013   CLINICAL DATA:  Right upper quadrant pain this morning, nausea  EXAM: ACUTE ABDOMEN SERIES (ABDOMEN 2 VIEW & CHEST 1 VIEW)  COMPARISON:  08/16/2012 CT scan  FINDINGS: Hyperinflation suggests COPD. The heart size and vascular pattern are normal. The lungs are clear. There is no free air. There is a primarily gasless abdomen. There are no abnormally dilated loops of bowel appreciated. There is some gas in the stomach. There are no abnormal opacities other than surgical clips right pelvis. There is gas into the nondilated sigmoid  colon and rectum.  IMPRESSION: No acute findings   Electronically Signed   By: Esperanza Heir M.D.   On: 12/28/2013 16:20     EKG Interpretation   Date/Time:  Tuesday December 28 2013 15:48:01 EDT Ventricular Rate:  87 PR Interval:  118 QRS Duration: 80 QT Interval:  360 QTC Calculation: 433 R Axis:   85 Text Interpretation:  Normal sinus rhythm Normal ECG Confirmed by POLLINA   MD, CHRISTOPHER [phone]) on 12/28/2013 3:53:42 PM      MDM   Final diagnoses:  None   COPD  Pleurisy  Patient presents to the ER for evaluation of pain in the right upper quadrant abdominal region as well as the right lower chest. Patient reports that she has had similar symptoms in the past with her COPD. Was, however, tenderness in the right upper quadrant therefore called her disease was considered. The acute abdominal x-ray did not show any abnormalities, including no pathology in the lung. Ultrasound performed and there is no evidence of gallbladder disease.  Examination arrival revealed significantly diminished breath sounds bilaterally consistent with her stated COPD. Since her gallbladder workup has been negative, it is felt that the COPD is the most likely cause of her symptoms. With the history of COPD, significant bronchospasm on arrival, it is not felt that PE is likely. Patient treated with nebulizer treatments here in a year with some improvement. She was also prescribed analgesia. Will discharge with antibiotics, prednisone. Followup with primary doctor.    Gilda Crease, MD 12/28/13 [phone]

## 2013-12-28 NOTE — ED Notes (Signed)
Severe sharp pain beneath right low rib cage that started this morning.  Sts she has had similar episodes for 2 years intermittently. Worse today. Sts she was sent to pulmonologist but she could not afford the medication.

## 2013-12-28 NOTE — ED Notes (Signed)
Pt requested Morphine and Phenergan.  MD notified.  No order received.

## 2013-12-28 NOTE — ED Notes (Signed)
I am not and have not been the primary nurse for this pt.

## 2013-12-28 NOTE — Discharge Instructions (Signed)
Chronic Obstructive Pulmonary Disease Exacerbation Chronic obstructive pulmonary disease (COPD) is a common lung condition in which airflow from the lungs is limited. COPD is a general term that can be used to describe many different lung problems that limit airflow, including chronic bronchitis and emphysema. COPD exacerbations are episodes when breathing symptoms become much worse and require extra treatment. Without treatment, COPD exacerbations can be life threatening, and frequent COPD exacerbations can cause further damage to your lungs. CAUSES   Respiratory infections.   Exposure to smoke.   Exposure to air pollution, chemical fumes, or dust. Sometimes there is no apparent cause or trigger. RISK FACTORS  Smoking cigarettes.  Older age.  Frequent prior COPD exacerbations. SIGNS AND SYMPTOMS   Increased coughing.   Increased thick spit (sputum) production.   Increased wheezing.   Increased shortness of breath.   Rapid breathing.   Chest tightness. DIAGNOSIS  Your medical history, a physical exam, and tests will help your health care provider make a diagnosis. Tests may include:  A chest X-ray.  Basic lab tests.  Sputum testing.  An arterial blood gas test. TREATMENT  Depending on the severity of your COPD exacerbation, you may need to be admitted to a hospital for treatment. Some of the treatments commonly used to treat COPD exacerbations are:   Antibiotic medicines.   Bronchodilators. These are drugs that expand the air passages. They may be given with an inhaler or nebulizer. Spacer devices may be needed to help improve drug delivery.  Corticosteroid medicines.  Supplemental oxygen therapy.  HOME CARE INSTRUCTIONS   Do not smoke. Quitting smoking is very important to prevent COPD from getting worse and exacerbations from happening as often.  Avoid exposure to all substances that irritate the airway, especially to tobacco smoke.   If prescribed,  take your antibiotics as directed. Finish them even if you start to feel better.  Only take over-the-counter or prescription medicines as directed by your health care provider.It is important to use correct technique with inhaled medicines.  Drink enough fluids to keep your urine clear or pale yellow (unless you have a medical condition that requires fluid restriction).  Use a cool mist vaporizer. This makes it easier to clear your chest when you cough.   If you have a home nebulizer and oxygen, continue to use them as directed.   Maintain all necessary vaccinations to prevent infections.   Exercise regularly.   Eat a healthy diet.   Keep all follow-up appointments as directed by your health care provider. SEEK IMMEDIATE MEDICAL CARE IF:  You have worsening shortness of breath.   You have trouble talking.   You have severe chest pain.  You have blood in your sputum.  You have a fever.  You have weakness, vomit repeatedly, or faint.   You feel confused.   You continue to get worse. MAKE SURE YOU:   Understand these instructions.  Will watch your condition.  Will get help right away if you are not doing well or get worse. Document Released: 04/14/2007 Document Revised: 04/07/2013 Document Reviewed: 02/19/2013 [facility] Patient Information 2015 [place], [place]. This information is not intended to replace advice given to you by your health care provider. Make sure you discuss any questions you have with your health care provider.  Pleurisy Pleurisy is an inflammation and swelling of the lining of the lungs (pleura). Because of this inflammation, it hurts to breathe. It can be aggravated by coughing, laughing, or deep breathing. Pleurisy is often caused by  an underlying infection or disease.  HOME CARE INSTRUCTIONS  Monitor your pleurisy for any changes. The following actions may help to alleviate any discomfort you are experiencing:  Medicine may help with pain.  Only take over-the-counter or prescription medicines for pain, discomfort, or fever as directed by your health care provider.  Only take antibiotic medicine as directed. Make sure to finish it even if you start to feel better. SEEK MEDICAL CARE IF:   Your pain is not controlled with medicine or is increasing.  You have an increase in pus-like (purulent) secretions brought up with coughing. SEEK IMMEDIATE MEDICAL CARE IF:   You have blue or dark lips, fingernails, or toenails.  You are coughing up blood.  You have increased difficulty breathing.  You have continuing pain unrelieved by medicine or pain lasting more than 1 week.  You have pain that radiates into your neck, arms, or jaw.  You develop increased shortness of breath or wheezing.  You develop a fever, rash, vomiting, fainting, or other serious symptoms. MAKE SURE YOU:  Understand these instructions.   Will watch your condition.   Will get help right away if you are not doing well or get worse.  Document Released: 06/17/2005 Document Revised: 02/17/2013 Document Reviewed: 11/29/2012 [facility]ExitCare Patient Information 2015 [place], [place]. This information is not intended to replace advice given to you by your health care provider. Make sure you discuss any questions you have with your health care provider.

## 2015-10-08 ENCOUNTER — Encounter (HOSPITAL_BASED_OUTPATIENT_CLINIC_OR_DEPARTMENT_OTHER): Payer: Self-pay | Admitting: *Deleted

## 2015-10-08 ENCOUNTER — Emergency Department (HOSPITAL_BASED_OUTPATIENT_CLINIC_OR_DEPARTMENT_OTHER): Payer: Self-pay

## 2015-10-08 ENCOUNTER — Emergency Department (HOSPITAL_BASED_OUTPATIENT_CLINIC_OR_DEPARTMENT_OTHER)
Admission: EM | Admit: 2015-10-08 | Discharge: 2015-10-09 | Disposition: A | Discharge status: Home / Self Care | Payer: Self-pay | Attending: Emergency Medicine | Admitting: Emergency Medicine

## 2015-10-08 DIAGNOSIS — F419 Anxiety disorder, unspecified: Secondary | ICD-10-CM | POA: Insufficient documentation

## 2015-10-08 DIAGNOSIS — F329 Major depressive disorder, single episode, unspecified: Secondary | ICD-10-CM | POA: Insufficient documentation

## 2015-10-08 DIAGNOSIS — F17210 Nicotine dependence, cigarettes, uncomplicated: Secondary | ICD-10-CM | POA: Insufficient documentation

## 2015-10-08 DIAGNOSIS — Z7952 Long term (current) use of systemic steroids: Secondary | ICD-10-CM | POA: Insufficient documentation

## 2015-10-08 DIAGNOSIS — Z880 Allergy status to penicillin: Secondary | ICD-10-CM | POA: Insufficient documentation

## 2015-10-08 DIAGNOSIS — Z87442 Personal history of urinary calculi: Secondary | ICD-10-CM | POA: Insufficient documentation

## 2015-10-08 DIAGNOSIS — Z79899 Other long term (current) drug therapy: Secondary | ICD-10-CM | POA: Insufficient documentation

## 2015-10-08 DIAGNOSIS — J449 Chronic obstructive pulmonary disease, unspecified: Secondary | ICD-10-CM | POA: Insufficient documentation

## 2015-10-08 DIAGNOSIS — I10 Essential (primary) hypertension: Secondary | ICD-10-CM | POA: Insufficient documentation

## 2015-10-08 DIAGNOSIS — Z8739 Personal history of other diseases of the musculoskeletal system and connective tissue: Secondary | ICD-10-CM | POA: Insufficient documentation

## 2015-10-08 DIAGNOSIS — K529 Noninfective gastroenteritis and colitis, unspecified: Secondary | ICD-10-CM | POA: Insufficient documentation

## 2015-10-08 LAB — TROPONIN I: Troponin I: <0.03 ng/mL (ref <0.031)

## 2015-10-08 LAB — CBC WITH DIFFERENTIAL/PLATELET
Basophils Absolute: 0 10*3/uL (ref 0.0–0.1)
Basophils Relative: 0 %
EOS ABS: 0.1 10*3/uL (ref 0.0–0.7)
EOS PCT: 2 %
HCT: 39.7 % (ref 36.0–46.0)
Hemoglobin: 13.7 g/dL (ref 12.0–15.0)
Lymphocytes Relative: 43 %
Lymphs Abs: 2.3 10*3/uL (ref 0.7–4.0)
MCH: 32.9 pg (ref 26.0–34.0)
MCHC: 34.5 g/dL (ref 30.0–36.0)
MCV: 95.4 fL (ref 78.0–100.0)
MONO ABS: 0.7 10*3/uL (ref 0.1–1.0)
Monocytes Relative: 13 %
Neutro Abs: 2.3 10*3/uL (ref 1.7–7.7)
Neutrophils Relative %: 42 %
PLATELETS: 159 10*3/uL (ref 150–400)
RBC: 4.16 MIL/uL (ref 3.87–5.11)
RDW: 14.2 % (ref 11.5–15.5)
WBC: 5.3 10*3/uL (ref 4.0–10.5)

## 2015-10-08 LAB — COMPREHENSIVE METABOLIC PANEL
ALT: 23 U/L (ref 14–54)
ANION GAP: 8 (ref 5–15)
AST: 25 U/L (ref 15–41)
Albumin: 4.2 g/dL (ref 3.5–5.0)
Alkaline Phosphatase: 26 U/L — ABNORMAL LOW (ref 38–126)
BUN: 28 mg/dL — ABNORMAL HIGH (ref 6–20)
CO2: 24 mmol/L (ref 22–32)
Calcium: 9 mg/dL (ref 8.9–10.3)
Chloride: 106 mmol/L (ref 101–111)
Creatinine, Ser: 0.66 mg/dL (ref 0.44–1.00)
Glucose, Bld: 103 mg/dL — ABNORMAL HIGH (ref 65–99)
Potassium: 3.7 mmol/L (ref 3.5–5.1)
SODIUM: 138 mmol/L (ref 135–145)
TOTAL PROTEIN: 7.1 g/dL (ref 6.5–8.1)
Total Bilirubin: 0.5 mg/dL (ref 0.3–1.2)

## 2015-10-08 LAB — LIPASE, BLOOD: LIPASE: 16 U/L (ref 11–51)

## 2015-10-08 MED ORDER — MORPHINE SULFATE (PF) 4 MG/ML IV SOLN
4.0000 mg | Freq: Once | INTRAVENOUS | Status: AC
  Administered 2015-10-08: 4 mg via INTRAVENOUS
  Filled 2015-10-08: qty 1

## 2015-10-08 MED ORDER — SODIUM CHLORIDE 0.9 % IV BOLUS (SEPSIS)
500.0000 mL | Freq: Once | INTRAVENOUS | Status: AC
  Administered 2015-10-08: 500 mL via INTRAVENOUS

## 2015-10-08 NOTE — ED Notes (Signed)
C/o R CP onset Friday, constant, describes as tight, not sure if solely r/t COPD, also reports nv, cough, congestion, weakness, back pain, and abd pain with standing (denies fever). Alert, NAD, calm, interactive, resps e/u, speaking in clear complete sentences, no dyspnea noted. Skin W&D.

## 2015-10-08 NOTE — ED Notes (Signed)
Pt given contrast for CT scan,

## 2015-10-08 NOTE — ED Provider Notes (Signed)
CSN: [id number]     Arrival date & time 10/08/15  2053 History  By signing my name below, I, [facility], attest that this documentation has been prepared under the direction and in the presence of Arby Barrette, MD. Electronically Signed: Randell Patient, ED Scribe. 10/08/2015. 1:29 AM.   Chief Complaint  Patient presents with  . Chest Pain   The history is provided by the patient. No language interpreter was used.  HPI Comments: Brietta [place] is a 60 y.o. female who presents to the Emergency Department complaining of constant, moderate RLQ and mid-abdominal pain onset 2 days ago. Patient states that pain began in the epigastrium but has since migrated to the mid- and RLQ of the abdomen. She reports diarrhea x3, nausea, one episode of emesis today, and a "bitter taste in her mouth from spitting up bile". She has a hx of appendectomy and colon surgery where she had a piece of her small intestine removed. Denies hx of cholecystectomy. Denies CP, SOB, constipation, dysuria.  Past Medical History  Diagnosis Date  . Hypertension   . Depression   . Anxiety   . Kidney stone   . Edema     lower extremities up to her knee  . Hemarthrosis involving knee joint   . Joint pain   . Nausea   . Chest pain   . Hemoptysis   . COPD (chronic obstructive pulmonary disease) [facility])    Past Surgical History  Procedure Laterality Date  . Abdominal hysterectomy  2010  . Knee surgery      left  . Colon surgery  2011  . Appendectomy  2010  . Tubal ligation     Family History  Problem Relation Age of Onset  . Diabetes type I Mother   . Hyperlipidemia    . Hypertension Mother    Social History  Substance Use Topics  . Smoking status: Current Every Day Smoker -- 1.00 packs/day    Types: Cigarettes  . Smokeless tobacco: Never Used     Comment: Start smoking at age 62.  Currently smoking 1 ppd.    . Alcohol Use: No     Comment: quit drinking Feb 10 , 2013   OB History    No data  available     Review of Systems A complete 10 system review of systems was obtained and all systems are negative except as noted in the HPI and PMH.   Allergies  Penicillins; Sulfa drugs cross reactors; and Zofran  Home Medications   Prior to Admission medications   Medication Sig Start Date End Date Taking? Authorizing Provider  albuterol (PROVENTIL HFA;VENTOLIN HFA) 108 (90 BASE) MCG/ACT inhaler Inhale into the lungs every 6 (six) hours as needed. Patient uses this medication for shortness of breath.    Historical Provider, MD  ALPRAZolam Prudy Feeler) 1 MG tablet Take 1 mg by mouth at bedtime.    Historical Provider, MD  Aspirin-Salicylamide-Caffeine (BC HEADACHE POWDER PO) Take by mouth as needed. For HA    Historical Provider, MD  BEE POLLEN PO Take 2 capsules by mouth daily.     Historical Provider, MD  ciprofloxacin (CIPRO) 500 MG tablet Take 1 tablet (500 mg total) by mouth 2 (two) times daily. One po bid x 7 days 12/28/13   Gilda Crease, MD  clonazePAM (KLONOPIN) 0.5 MG tablet Take 1.5 mg by mouth as needed.    Historical Provider, MD  Diphenhydramine-APAP, sleep, (GOODY PM PO) Take by mouth at bedtime.  Historical Provider, MD  HYDROcodone-acetaminophen (NORCO/VICODIN) 5-325 MG tablet Take 1-2 tablets by mouth every 4 (four) hours as needed for moderate pain or severe pain. 10/09/15   Arby Barrette, MD  ibuprofen (ADVIL,MOTRIN) 200 MG tablet Take 800 mg by mouth 2 (two) times daily as needed. For pain    Historical Provider, MD  levofloxacin (LEVAQUIN) 500 MG tablet Take 1 tablet (500 mg total) by mouth daily. 10/09/15   Arby Barrette, MD  metroNIDAZOLE (FLAGYL) 500 MG tablet Take 1 tablet (500 mg total) by mouth 2 (two) times daily. One po bid x 7 days 10/09/15   Arby Barrette, MD  predniSONE (DELTASONE) 10 MG tablet Take 4 tabs  daily with food x 4 days, then 3 tabs daily x 4 days, then 2 tabs daily x 4 days, then 1 tab daily x4 days then stop 03/31/12   Oretha Milch, MD   predniSONE (DELTASONE) 20 MG tablet Take 1 tablet (20 mg total) by mouth daily. 12/28/13   Gilda Crease, MD  promethazine-codeine (PHENERGAN WITH CODEINE) 6.25-10 MG/5ML syrup Take 5 mLs by mouth 2 (two) times daily as needed for cough. 05/01/12   Oretha Milch, MD  sertraline (ZOLOFT) 100 MG tablet Take 50 mg by mouth daily.     Historical Provider, MD   BP 145/86 mmHg  Pulse 79  Temp(Src) 97.9 F (36.6 C) (Oral)  Resp 18  Ht  (1.676 m)  Wt 98 lb (44.453 kg)  BMI 15.83 kg/m2  SpO2 98% Physical Exam  Constitutional: She is oriented to person, place, and time. She appears well-developed and well-nourished. No distress.  HENT:  Head: Normocephalic and atraumatic.  Eyes: Conjunctivae are normal.  Neck: Normal range of motion.  Cardiovascular: Normal rate, regular rhythm and normal heart sounds.  Exam reveals no gallop and no friction rub.   No murmur heard. Pulmonary/Chest: Effort normal and breath sounds normal. No respiratory distress. She has no wheezes. She has no rhonchi. She has no rales.  Abdominal: Soft. She exhibits no distension and no mass. There is tenderness. There is no guarding.  Pain in RLQ and right lateral quadrant without palpable mass or guarding.  Musculoskeletal: Normal range of motion.  Neurological: She is alert and oriented to person, place, and time.  Skin: Skin is warm and dry.  Psychiatric: She has a normal mood and affect. Her behavior is normal.    ED Course  Procedures   DIAGNOSTIC STUDIES: Oxygen Saturation is 96% on RA, adequate by my interpretation.    COORDINATION OF CARE: 10:09 PM Will order morphine, IV fluids, labs and CT abdomen. Discussed treatment plan with pt at bedside and pt agreed to plan.   Labs Review Labs Reviewed  COMPREHENSIVE METABOLIC PANEL - Abnormal; Notable for the following:    Glucose, Bld 103 (*)    BUN 28 (*)    Alkaline Phosphatase 26 (*)    All other components within normal limits  LIPASE, BLOOD   TROPONIN I  CBC WITH DIFFERENTIAL/PLATELET  URINALYSIS, ROUTINE W REFLEX MICROSCOPIC (NOT AT [facility])    Imaging Review Dg Chest 2 View  10/08/2015  CLINICAL DATA:  Right-sided chest pain with shortness of breath for 3 days EXAM: CHEST  2 VIEW COMPARISON:  12/28/2013 FINDINGS: Cardiac shadow is within normal limits. The lungs are hyperinflated bilaterally. No focal infiltrate or sizable effusion is seen. No acute bony abnormality is noted. IMPRESSION: No acute abnormality noted. Electronically Signed   By: Eulah Pont.D.  On: 10/08/2015 21:38   Ct Abdomen Pelvis W Contrast  10/09/2015  CLINICAL DATA:  Acute onset of moderate right lower quadrant abdominal pain. Diarrhea, nausea and vomiting. Initial encounter. EXAM: CT ABDOMEN AND PELVIS WITH CONTRAST TECHNIQUE: Multidetector CT imaging of the abdomen and pelvis was performed using the standard protocol following bolus administration of intravenous contrast. CONTRAST:  100mL ISOVUE-300 IOPAMIDOL (ISOVUE-300) INJECTION 61% COMPARISON:  Abdominal ultrasound performed 12/28/2013, and CT of the abdomen and pelvis performed 08/16/2012 FINDINGS: The visualized lung bases are clear. The liver and spleen are unremarkable in appearance. The gallbladder is within normal limits. The pancreas is borderline prominent, though likely still within normal limits. The adrenal glands are unremarkable. Small bilateral renal cysts are noted. There is no evidence of hydronephrosis. No renal or ureteral stones are seen. No perinephric stranding is appreciated. There is distention of small-bowel loops up to 4.1 cm in diameter, with mild diffuse wall thickening, raising concern for mild infectious or inflammatory ileitis. The ileocolic anastomosis is unremarkable in appearance. The stomach is within normal limits. No acute vascular abnormalities are seen. The remaining colon is grossly unremarkable in appearance. The bladder is moderately distended and grossly unremarkable. The  patient is status post hysterectomy. No suspicious adnexal masses are seen. No inguinal lymphadenopathy is seen. No acute osseous abnormalities are identified. IMPRESSION: 1. Distention of small bowel loops up to 4.1 cm in diameter, with diffuse mild wall thickening, concerning for mild infectious or inflammatory ileitis. 2. Small bilateral renal cysts noted. Electronically Signed   By: Roanna RaiderJeffery  Chang M.D.   On: 10/09/2015 00:43   I have personally reviewed and evaluated these images and lab results as part of my medical decision-making.   EKG Interpretation   Date/Time:  Sunday October 08 2015 21:04:52 EDT Ventricular Rate:  100 PR Interval:  108 QRS Duration: 76 QT Interval:  324 QTC Calculation: 417 R Axis:   81 Text Interpretation:  Sinus rhythm with short PR with Premature atrial  complexes Otherwise normal ECG no STEMI. ST depression inferior consistent  with old Confirmed by Donnald GarrePfeiffer, MD, Lebron ConnersMarcy [phone]([id number]) on 10/08/2015 10:10:11 PM      MDM   Final diagnoses:  Enteritis   Patient was advised to stay in the hospital for observation and treatment for bowel wall inflammation and some dilation. She describes prior history of what sounds like bowel obstruction. At this time she is not obstructed and is nontoxic. Patient advises that she must go home and has things that she needs to take care of (dogs and grandchildren). She advises that she cannot stay in the hospital this time. She wishes to initiate outpatient management and is going to be calling her surgeon first thing in the morning. She advises she will return immediately if she is having any worsening conditions. Patient is aware of risks of increasing infection and possible bowel perforation or obstruction.   Arby BarretteMarcy Akiah Bauch, MD 10/09/15 0130

## 2015-10-09 LAB — URINALYSIS, ROUTINE W REFLEX MICROSCOPIC
BILIRUBIN URINE: NEGATIVE
Glucose, UA: NEGATIVE mg/dL
Hgb urine dipstick: NEGATIVE
KETONES UR: NEGATIVE mg/dL
LEUKOCYTES UA: NEGATIVE
NITRITE: NEGATIVE
PH: 6 (ref 5.0–8.0)
Protein, ur: NEGATIVE mg/dL
SPECIFIC GRAVITY, URINE: 1.01 (ref 1.005–1.030)

## 2015-10-09 MED ORDER — LEVOFLOXACIN IN D5W 500 MG/100ML IV SOLN
INTRAVENOUS | Status: AC
  Filled 2015-10-09: qty 100

## 2015-10-09 MED ORDER — METRONIDAZOLE IN NACL 5-0.79 MG/ML-% IV SOLN
500.0000 mg | Freq: Once | INTRAVENOUS | Status: DC
Start: 2015-10-09 — End: 2015-10-09
  Filled 2015-10-09: qty 100

## 2015-10-09 MED ORDER — LEVOFLOXACIN 500 MG PO TABS: 500.0000 mg | ORAL_TABLET | Freq: Every day | ORAL | Status: DC

## 2015-10-09 MED ORDER — HYDROCODONE-ACETAMINOPHEN 5-325 MG PO TABS: 1.0000 | ORAL_TABLET | ORAL | Status: DC | PRN

## 2015-10-09 MED ORDER — LEVOFLOXACIN 500 MG PO TABS
500.0000 mg | ORAL_TABLET | Freq: Once | ORAL | Status: AC
  Administered 2015-10-09: 500 mg via ORAL
  Filled 2015-10-09: qty 1

## 2015-10-09 MED ORDER — IOPAMIDOL (ISOVUE-300) INJECTION 61%
100.0000 mL | Freq: Once | INTRAVENOUS | Status: AC | PRN
  Administered 2015-10-09: 100 mL via INTRAVENOUS

## 2015-10-09 MED ORDER — METRONIDAZOLE 500 MG PO TABS
500.0000 mg | ORAL_TABLET | Freq: Once | ORAL | Status: AC
  Administered 2015-10-09: 500 mg via ORAL
  Filled 2015-10-09: qty 1

## 2015-10-09 MED ORDER — METRONIDAZOLE 500 MG PO TABS: 500.0000 mg | ORAL_TABLET | Freq: Two times a day (BID) | ORAL | Status: DC

## 2015-10-09 MED ORDER — LEVOFLOXACIN IN D5W 500 MG/100ML IV SOLN
500.0000 mg | Freq: Once | INTRAVENOUS | Status: DC
  Filled 2015-10-09: qty 100

## 2015-10-09 NOTE — Discharge Instructions (Signed)
Abdominal Pain, Adult Many things can cause abdominal pain. Usually, abdominal pain is not caused by a disease and will improve without treatment. It can often be observed and treated at home. Your health care provider will do a physical exam and possibly order blood tests and X-rays to help determine the seriousness of your pain. However, in many cases, more time must pass before a clear cause of the pain can be found. Before that point, your health care provider may not know if you need more testing or further treatment. HOME CARE INSTRUCTIONS Monitor your abdominal pain for any changes. The following actions may help to alleviate any discomfort you are experiencing:  Only take over-the-counter or prescription medicines as directed by your health care provider.  Do not take laxatives unless directed to do so by your health care provider.  Try a clear liquid diet (broth, tea, or water) as directed by your health care provider. Slowly move to a bland diet as tolerated. SEEK MEDICAL CARE IF:  You have unexplained abdominal pain.  You have abdominal pain associated with nausea or diarrhea.  You have pain when you urinate or have a bowel movement.  You experience abdominal pain that wakes you in the night.  You have abdominal pain that is worsened or improved by eating food.  You have abdominal pain that is worsened with eating fatty foods.  You have a fever. SEEK IMMEDIATE MEDICAL CARE IF:  Your pain does not go away within 2 hours.  You keep throwing up (vomiting).  Your pain is felt only in portions of the abdomen, such as the right side or the left lower portion of the abdomen.  You pass bloody or black tarry stools. MAKE SURE YOU:  Understand these instructions.  Will watch your condition.  Will get help right away if you are not doing well or get worse.   This information is not intended to replace advice given to you by your health care provider. Make sure you discuss  any questions you have with your health care provider.   Document Released: 03/27/2005 Document Revised: 03/08/2015 Document Reviewed: 02/24/2013 Elsevier Interactive Patient Education 2016 [employer] Warning information for Small Bowel Obstruction A small bowel obstruction is a blockage in the small bowel. The small bowel, which is also called the small intestine, is a long, slender tube that connects the stomach to the colon. When a person eats and drinks, food and fluids go from the stomach to the small bowel. This is where most of the nutrients in the food and fluids are absorbed. A small bowel obstruction will prevent food and fluids from passing through the small bowel as they normally do during digestion. The small bowel can become partially or completely blocked. This can cause symptoms such as abdominal pain, vomiting, and bloating. If this condition is not treated, it can be dangerous because the small bowel could rupture. CAUSES Common causes of this condition include:  Scar tissue from previous surgery or radiation treatment.  Recent surgery. This may cause the movements of the bowel to slow down and cause food to block the intestine.  Hernias.  Inflammatory bowel disease (colitis).  Twisting of the bowel (volvulus).  Tumors.  A foreign body.  Slipping of a part of the bowel into another part (intussusception). SYMPTOMS Symptoms of this condition include:  Abdominal pain. This may be dull cramps or sharp pain. It may occur in one area, or it may be present in the entire abdomen. Pain can  range from mild to severe, depending on the degree of obstruction.  Nausea and vomiting. Vomit may be greenish or a yellow bile color.  Abdominal bloating.  Constipation.  Lack of passing gas.  Frequent belching.  Diarrhea. This may occur if the obstruction is partial and runny stool is able to leak around the obstruction. DIAGNOSIS This condition may be diagnosed based on a  physical exam, medical history, and X-rays of the abdomen. You may also have other tests, such as a CT scan of the abdomen and pelvis. TREATMENT Treatment for this condition depends on the cause and severity of the problem. Treatment options may include:  Bed rest along with fluids and pain medicines that are given through an IV tube inserted into one of your veins. Sometimes, this is all that is needed for the obstruction to improve.  Following a simple diet. In some cases, a clear liquid diet may be required for several days. This allows the bowel to rest.  Placement of a small tube (nasogastric tube) into the stomach. When the bowel is blocked, it usually swells up like a balloon that is filled with air and fluids. The air and fluids may be removed by suction through the nasogastric tube. This can help with pain, discomfort, and nausea. It can also help the obstruction to clear up faster.  Surgery. This may be required if other treatments do not work. Bowel obstruction from a hernia may require early surgery and can be an emergency procedure. Surgery may also be required for scar tissue that causes frequent or severe obstructions. HOME CARE INSTRUCTIONS  Get plenty of rest.  Follow instructions from your health care provider about eating restrictions. You may need to avoid solid foods and consume only clear liquids until your condition improves.  Take over-the-counter and prescription medicines only as told by your health care provider.  Keep all follow-up visits as told by your health care provider. This is important. SEEK MEDICAL CARE IF:  You have a fever.  You have chills. SEEK IMMEDIATE MEDICAL CARE IF:  You have increased pain or cramping.  You vomit blood.  You have uncontrolled vomiting or nausea.  You cannot drink fluids because of vomiting or pain.  You develop confusion.  You begin feeling very dry or thirsty (dehydrated).  You have severe bloating.  You feel  extremely weak or you faint.   This information is not intended to replace advice given to you by your health care provider. Make sure you discuss any questions you have with your health care provider.   Document Released: 09/03/2005 Document Revised: 03/08/2015 Document Reviewed: 08/11/2014 Elsevier Interactive Patient Education [employer]

## 2017-05-16 ENCOUNTER — Encounter (HOSPITAL_BASED_OUTPATIENT_CLINIC_OR_DEPARTMENT_OTHER): Payer: Self-pay | Admitting: Emergency Medicine

## 2017-05-16 ENCOUNTER — Emergency Department (HOSPITAL_BASED_OUTPATIENT_CLINIC_OR_DEPARTMENT_OTHER)
Admission: EM | Admit: 2017-05-16 | Discharge: 2017-05-16 | Discharge status: Against Medical Advice | Payer: Self-pay | Attending: Emergency Medicine | Admitting: Emergency Medicine

## 2017-05-16 ENCOUNTER — Other Ambulatory Visit: Payer: Self-pay

## 2017-05-16 ENCOUNTER — Emergency Department (HOSPITAL_BASED_OUTPATIENT_CLINIC_OR_DEPARTMENT_OTHER): Payer: Self-pay

## 2017-05-16 DIAGNOSIS — A419 Sepsis, unspecified organism: Secondary | ICD-10-CM

## 2017-05-16 DIAGNOSIS — F17210 Nicotine dependence, cigarettes, uncomplicated: Secondary | ICD-10-CM | POA: Insufficient documentation

## 2017-05-16 DIAGNOSIS — N179 Acute kidney failure, unspecified: Secondary | ICD-10-CM | POA: Insufficient documentation

## 2017-05-16 DIAGNOSIS — E876 Hypokalemia: Secondary | ICD-10-CM | POA: Insufficient documentation

## 2017-05-16 DIAGNOSIS — J449 Chronic obstructive pulmonary disease, unspecified: Secondary | ICD-10-CM | POA: Insufficient documentation

## 2017-05-16 DIAGNOSIS — Z79899 Other long term (current) drug therapy: Secondary | ICD-10-CM | POA: Insufficient documentation

## 2017-05-16 DIAGNOSIS — I10 Essential (primary) hypertension: Secondary | ICD-10-CM | POA: Insufficient documentation

## 2017-05-16 DIAGNOSIS — N390 Urinary tract infection, site not specified: Secondary | ICD-10-CM | POA: Insufficient documentation

## 2017-05-16 DIAGNOSIS — A4189 Other specified sepsis: Secondary | ICD-10-CM | POA: Insufficient documentation

## 2017-05-16 LAB — CBC WITH DIFFERENTIAL/PLATELET
Basophils Absolute: 0 10*3/uL (ref 0.0–0.1)
Basophils Relative: 0 %
EOS PCT: 0 %
Eosinophils Absolute: 0 10*3/uL (ref 0.0–0.7)
HEMATOCRIT: 33.5 % — AB (ref 36.0–46.0)
Hemoglobin: 11.2 g/dL — ABNORMAL LOW (ref 12.0–15.0)
Lymphocytes Relative: 6 %
Lymphs Abs: 1 10*3/uL (ref 0.7–4.0)
MCH: 29.9 pg (ref 26.0–34.0)
MCHC: 33.4 g/dL (ref 30.0–36.0)
MCV: 89.6 fL (ref 78.0–100.0)
MONO ABS: 0.9 10*3/uL (ref 0.1–1.0)
MONOS PCT: 6 %
NEUTROS ABS: 13.9 10*3/uL — AB (ref 1.7–7.7)
Neutrophils Relative %: 88 %
PLATELETS: 261 10*3/uL (ref 150–400)
RBC: 3.74 MIL/uL — ABNORMAL LOW (ref 3.87–5.11)
RDW: 13.5 % (ref 11.5–15.5)
WBC: 15.8 10*3/uL — ABNORMAL HIGH (ref 4.0–10.5)

## 2017-05-16 LAB — URINALYSIS, ROUTINE W REFLEX MICROSCOPIC
BILIRUBIN URINE: NEGATIVE
GLUCOSE, UA: NEGATIVE mg/dL
KETONES UR: NEGATIVE mg/dL
NITRITE: POSITIVE — AB
PH: 6 (ref 5.0–8.0)
PROTEIN: 100 mg/dL — AB
Specific Gravity, Urine: 1.015 (ref 1.005–1.030)

## 2017-05-16 LAB — COMPREHENSIVE METABOLIC PANEL
ALK PHOS: 66 U/L (ref 38–126)
ALT: 12 U/L — ABNORMAL LOW (ref 14–54)
ANION GAP: 12 (ref 5–15)
AST: 20 U/L (ref 15–41)
Albumin: 3 g/dL — ABNORMAL LOW (ref 3.5–5.0)
BUN: 29 mg/dL — AB (ref 6–20)
CHLORIDE: 102 mmol/L (ref 101–111)
CO2: 23 mmol/L (ref 22–32)
Calcium: 9.2 mg/dL (ref 8.9–10.3)
Creatinine, Ser: 1.67 mg/dL — ABNORMAL HIGH (ref 0.44–1.00)
GFR calc Af Amer: 38 mL/min — ABNORMAL LOW (ref >60)
GFR calc non Af Amer: 33 mL/min — ABNORMAL LOW (ref >60)
GLUCOSE: 119 mg/dL — AB (ref 65–99)
Potassium: 2.7 mmol/L — CL (ref 3.5–5.1)
SODIUM: 137 mmol/L (ref 135–145)
Total Bilirubin: 0.6 mg/dL (ref 0.3–1.2)
Total Protein: 7.1 g/dL (ref 6.5–8.1)

## 2017-05-16 LAB — I-STAT CG4 LACTIC ACID, ED
Lactic Acid, Venous: 2.23 mmol/L (ref 0.5–1.9)
Lactic Acid, Venous: 0.65 mmol/L (ref 0.5–1.9)

## 2017-05-16 LAB — URINALYSIS, MICROSCOPIC (REFLEX)

## 2017-05-16 LAB — CBG MONITORING, ED: Glucose-Capillary: 118 mg/dL — ABNORMAL HIGH (ref 65–99)

## 2017-05-16 LAB — INFLUENZA PANEL BY PCR (TYPE A & B)
Influenza A By PCR: NEGATIVE
Influenza B By PCR: NEGATIVE

## 2017-05-16 LAB — MAGNESIUM: Magnesium: 1.8 mg/dL (ref 1.7–2.4)

## 2017-05-16 MED ORDER — POTASSIUM CHLORIDE 10 MEQ/100ML IV SOLN
10.0000 meq | Freq: Once | INTRAVENOUS | Status: AC
  Administered 2017-05-16: 10 meq via INTRAVENOUS
  Filled 2017-05-16: qty 100

## 2017-05-16 MED ORDER — SODIUM CHLORIDE 0.9 % IV BOLUS (SEPSIS)
1000.0000 mL | Freq: Once | INTRAVENOUS | Status: AC
  Administered 2017-05-16: 1000 mL via INTRAVENOUS

## 2017-05-16 MED ORDER — LEVOFLOXACIN 750 MG PO TABS: 750.0000 mg | ORAL_TABLET | Freq: Every day | ORAL | Status: DC

## 2017-05-16 MED ORDER — LEVOFLOXACIN 750 MG PO TABS: 750.0000 mg | ORAL_TABLET | Freq: Every day | ORAL | 0 refills | Status: DC

## 2017-05-16 MED ORDER — LEVOFLOXACIN IN D5W 750 MG/150ML IV SOLN
750.0000 mg | Freq: Once | INTRAVENOUS | Status: AC
Start: 2017-05-16 — End: 2017-05-16
  Administered 2017-05-16: 750 mg via INTRAVENOUS
  Filled 2017-05-16: qty 150

## 2017-05-16 MED ORDER — LEVOFLOXACIN 750 MG PO TABS: 750.0000 mg | ORAL_TABLET | ORAL | 0 refills | Status: AC

## 2017-05-16 MED ORDER — LEVOFLOXACIN 750 MG PO TABS: 750.0000 mg | ORAL_TABLET | ORAL | Status: DC

## 2017-05-16 MED ORDER — POTASSIUM CHLORIDE CRYS ER 20 MEQ PO TBCR: 20.0000 meq | EXTENDED_RELEASE_TABLET | Freq: Every day | ORAL | 0 refills | Status: DC

## 2017-05-16 MED ORDER — POTASSIUM CHLORIDE CRYS ER 20 MEQ PO TBCR
40.0000 meq | EXTENDED_RELEASE_TABLET | Freq: Once | ORAL | Status: AC
  Administered 2017-05-16: 40 meq via ORAL
  Filled 2017-05-16: qty 2

## 2017-05-16 MED ORDER — ACETAMINOPHEN 325 MG PO TABS
650.0000 mg | ORAL_TABLET | Freq: Once | ORAL | Status: AC
  Administered 2017-05-16: 650 mg via ORAL
  Filled 2017-05-16: qty 2

## 2017-05-16 NOTE — ED Triage Notes (Addendum)
Patient reports feeling weak and dehydrated x 1 month.  Reports she knows she is dehydrated "because my mouth was stuck together".  Denies N/V/D.

## 2017-05-16 NOTE — ED Notes (Signed)
Lab notified of orders 

## 2017-05-16 NOTE — ED Notes (Signed)
ED Provider at bedside. 

## 2017-05-16 NOTE — Progress Notes (Signed)
Pharmacy Antibiotic Note  Annette Palmer is a 61 y.o. female admitted on 05/16/2017 with UTI.  Pharmacy has been consulted for levaquin dosing. WBC 16, Tmax 101.3, Lactic acid 2.23, sCr 1.67 (baseline ~0.7) CrCl ~ 21 mL/min  Plan: Levaquin 750mg  q24 hours F/U renal function, cultures, clinical progression and LOT   Height: 5\' 6"  (167.6 cm) Weight: 84 lb (38.1 kg) IBW/kg (Calculated) : 59.3  Temp (24hrs), Avg:99.8 F (37.7 C), Min:98.3 F (36.8 C), Max:101.3 F (38.5 C)  Recent Labs  Lab 05/16/17 0814 05/16/17 0828  WBC 15.8*  --   CREATININE 1.67*  --   LATICACIDVEN  --  2.23*    Estimated Creatinine Clearance: 21.8 mL/min (A) (by C-G formula based on SCr of 1.67 mg/dL (H)).    Allergies  Allergen Reactions  . Penicillins Hives and Swelling  . Sulfa Drugs Cross Reactors   . Zofran [Ondansetron Hcl]     "It makes me sick"    Thank you for allowing pharmacy to be a part of this patient's care.  Toniann Failony L Natoshia Souter 05/16/2017 10:26 AM

## 2017-05-16 NOTE — ED Provider Notes (Signed)
[facility] EMERGENCY DEPARTMENT Provider Note   CSN: [id number] Arrival date & time: 05/16/17  [id number]     History   Chief Complaint Chief Complaint  Patient presents with  . Weakness    HPI Annette Palmer is a 61 y.o. female.  HPI  61 year old female with a history of COPD presents with weakness and feeling dehydrated.  She states she felt this way about a month ago and at that time was drinking mostly coke only.  She was feeling so dehydrated that finally someone gave her water and Gatorade and she felt much better.  However she feels herself going back into that dehydration feeling over the last week or so.  She states that over the last 1 week she has been having diffuse aching in her bones as well as some low back pain.  Has been feeling like her mouth is dry.  She has felt some chills during this week but did not know she had a fever.  Her rectal temperature was 101.3.  She has a chronic cough but denies any new or worsening cough.  She denies headache, sore throat, congestion, shortness of breath, chest pain, abdominal pain, vomiting, diarrhea, or urinary symptoms.  She states her legs have felt weak over the last 1 week, denies numbness or urinary/bladder incontinence.  Past Medical History:  Diagnosis Date  . Anxiety   . Chest pain   . COPD (chronic obstructive pulmonary disease) (HCC)   . Depression   . Edema    lower extremities up to her knee  . Hemarthrosis involving knee joint   . Hemoptysis   . Hypertension   . Joint pain   . Kidney stone   . Nausea     Patient Active Problem List   Diagnosis Date Noted  . COPD (chronic obstructive pulmonary disease) (HCC) 03/31/2012  . Tobacco abuse 03/31/2012    Past Surgical History:  Procedure Laterality Date  . ABDOMINAL HYSTERECTOMY  2010  . APPENDECTOMY  2010  . COLON SURGERY  2011  . KNEE SURGERY     left  . TUBAL LIGATION      OB History    No data available       Home Medications     Prior to Admission medications   Medication Sig Start Date End Date Taking? Authorizing Provider  albuterol (PROVENTIL HFA;VENTOLIN HFA) 108 (90 BASE) MCG/ACT inhaler Inhale into the lungs every 6 (six) hours as needed. Patient uses this medication for shortness of breath.    [provider]  ALPRAZolam Prudy Feeler(XANAX) 1 MG tablet Take 1 mg by mouth at bedtime.    [provider]  Aspirin-Salicylamide-Caffeine (BC HEADACHE POWDER PO) Take by mouth as needed. For HA    [provider]  BEE POLLEN PO Take 2 capsules by mouth daily.     [provider]  clonazePAM (KLONOPIN) 0.5 MG tablet Take 1.5 mg by mouth as needed.    [provider]  Diphenhydramine-APAP, sleep, (GOODY PM PO) Take by mouth at bedtime.    [provider]  ibuprofen (ADVIL,MOTRIN) 200 MG tablet Take 800 mg by mouth 2 (two) times daily as needed. For pain    [provider]  levofloxacin (LEVAQUIN) 750 MG tablet Take 1 tablet (750 mg total) every other day for 5 doses by mouth. X 7 days 05/16/17 05/25/17  Pricilla LovelessGoldston, Maurio Baize, MD  potassium chloride SA (K-DUR,KLOR-CON) 20 MEQ tablet Take 1 tablet (20 mEq total) daily by mouth. 05/16/17  Pricilla Loveless, MD  predniSONE (DELTASONE) 10 MG tablet Take 4 tabs  daily with food x 4 days, then 3 tabs daily x 4 days, then 2 tabs daily x 4 days, then 1 tab daily x4 days then stop 03/31/12   Oretha Milch, MD  sertraline (ZOLOFT) 100 MG tablet Take 50 mg by mouth daily.     [provider]    Family History Family History  Problem Relation Age of Onset  . Diabetes type I Mother   . Hypertension Mother   . Hyperlipidemia Unknown     Social History Social History   Tobacco Use  . Smoking status: Current Every Day Smoker    Packs/day: 1.00    Types: Cigarettes  . Smokeless tobacco: Never Used  . Tobacco comment: Start smoking at age 89.  Currently smoking 1 ppd.    Substance Use Topics  . Alcohol use: No    Comment:  quit drinking Feb 10 , 2013  . Drug use: No     Allergies   Penicillins; Sulfa drugs cross reactors; and Zofran [ondansetron hcl]   Review of Systems Review of Systems  Constitutional: Positive for chills. Negative for fever.  HENT: Negative for congestion and sore throat.   Respiratory: Positive for cough. Negative for shortness of breath.   Gastrointestinal: Negative for abdominal pain, diarrhea and vomiting.  Genitourinary: Negative for dysuria.  Musculoskeletal: Positive for arthralgias and back pain. Negative for neck pain.  Neurological: Positive for weakness. Negative for headaches.  All other systems reviewed and are negative.    Physical Exam Updated Vital Signs BP 91/62   Pulse 85   Temp 98.4 F (36.9 C) (Oral)   Resp 20   Ht 5\' 6"  (1.676 m)   Wt 38.1 kg (84 lb)   SpO2 100%   BMI 13.56 kg/m   Physical Exam  Constitutional: She is oriented to person, place, and time. She appears well-developed.  Frail, cachectic  HENT:  Head: Normocephalic and atraumatic.  Right Ear: External ear normal.  Left Ear: External ear normal.  Nose: Nose normal.  Mouth/Throat: Mucous membranes are dry. No oropharyngeal exudate, posterior oropharyngeal edema, posterior oropharyngeal erythema or tonsillar abscesses.  Eyes: Pupils are equal, round, and reactive to light. Right eye exhibits no discharge. Left eye exhibits no discharge.  Neck: Normal range of motion. Neck supple.  Cardiovascular: Normal rate, regular rhythm and normal heart sounds.  Pulmonary/Chest: Effort normal and breath sounds normal. She has no wheezes.  Abdominal: Soft. She exhibits no distension. There is no tenderness.  Musculoskeletal:       Cervical back: She exhibits no tenderness.       Thoracic back: She exhibits no tenderness.       Lumbar back: She exhibits no tenderness.  Neurological: She is alert and oriented to person, place, and time. She displays no Babinski's sign on the right side. She displays  no Babinski's sign on the left side.  Reflex Scores:      Patellar reflexes are 3+ on the right side and 3+ on the left side.      Achilles reflexes are 2+ on the right side and 2+ on the left side. 5/5 strength in all 4 extremities. Normal gross sensation  Skin: Skin is warm and dry.  Nursing note and vitals reviewed.    ED Treatments / Results  Labs (all labs ordered are listed, but only abnormal results are displayed) Labs Reviewed  COMPREHENSIVE METABOLIC PANEL - Abnormal; Notable for  the following components:      Result Value   Potassium 2.7 (*)    Glucose, Bld 119 (*)    BUN 29 (*)    Creatinine, Ser 1.67 (*)    Albumin 3.0 (*)    ALT 12 (*)    GFR calc non Af Amer 33 (*)    GFR calc Af Amer 38 (*)    All other components within normal limits  CBC WITH DIFFERENTIAL/PLATELET - Abnormal; Notable for the following components:   WBC 15.8 (*)    RBC 3.74 (*)    Hemoglobin 11.2 (*)    HCT 33.5 (*)    Neutro Abs 13.9 (*)    All other components within normal limits  URINALYSIS, ROUTINE W REFLEX MICROSCOPIC - Abnormal; Notable for the following components:   APPearance CLOUDY (*)    Hgb urine dipstick MODERATE (*)    Protein, ur 100 (*)    Nitrite POSITIVE (*)    Leukocytes, UA LARGE (*)    All other components within normal limits  URINALYSIS, MICROSCOPIC (REFLEX) - Abnormal; Notable for the following components:   Bacteria, UA MANY (*)    Squamous Epithelial / LPF 0-5 (*)    All other components within normal limits  I-STAT CG4 LACTIC ACID, ED - Abnormal; Notable for the following components:   Lactic Acid, Venous 2.23 (*)    All other components within normal limits  CBG MONITORING, ED - Abnormal; Notable for the following components:   Glucose-Capillary 118 (*)    All other components within normal limits  CULTURE, BLOOD (ROUTINE X 2)  CULTURE, BLOOD (ROUTINE X 2)  URINE CULTURE  MAGNESIUM  INFLUENZA PANEL BY PCR (TYPE A & B)  I-STAT CG4 LACTIC ACID, ED     EKG  EKG Interpretation  Date/Time:  Friday May 16 2017 08:05:23 EST Ventricular Rate:  99 PR Interval:    QRS Duration: 65 QT Interval:  319 QTC Calculation: 410 R Axis:   78 Text Interpretation:  Normal sinus rhythm Borderline repolarization abnormality ST segments similar to April 2017 Confirmed by Pricilla LovelessGoldston, Lonzy Mato [phone]([id number]) on 05/16/2017 8:08:20 AM       Radiology Dg Chest 2 View  Result Date: 05/16/2017 CLINICAL DATA:  Fever and dehydration EXAM: CHEST  2 VIEW COMPARISON:  October 08, 2015 FINDINGS: Lungs are hyperexpanded. There is mild scarring in the apices. There is also mild scarring in each mid lung. There is no edema or consolidation. Heart size and pulmonary vascularity are normal. No adenopathy. No bone lesions. IMPRESSION: Lungs hyperexpanded with scattered areas of scarring. No edema or consolidation. Cardiac silhouette within normal limits. Electronically Signed   By: Bretta BangWilliam  Woodruff III M.D.   On: 05/16/2017 08:52    Procedures Procedures (including critical care time)  Medications Ordered in ED Medications  levofloxacin (LEVAQUIN) tablet 750 mg (not administered)  acetaminophen (TYLENOL) tablet 650 mg (650 mg Oral Given 05/16/17 0755)  sodium chloride 0.9 % bolus 1,000 mL (0 mLs Intravenous Stopped 05/16/17 0929)  potassium chloride SA (K-DUR,KLOR-CON) CR tablet 40 mEq (40 mEq Oral Given 05/16/17 0928)  sodium chloride 0.9 % bolus 1,000 mL (0 mLs Intravenous Stopped 05/16/17 1152)  potassium chloride 10 mEq in 100 mL IVPB (0 mEq Intravenous Stopped 05/16/17 1151)  levofloxacin (LEVAQUIN) IVPB 750 mg (0 mg Intravenous Stopped 05/16/17 1152)     Initial Impression / Assessment and Plan / ED Course  I have reviewed the triage vital signs and the nursing notes.  Pertinent labs &  imaging results that were available during my care of the patient were reviewed by me and considered in my medical decision making (see chart for details).     Patient's  workup shows a rectal temperature of 101.  I believe this come from a urinary tract infection with positive nitrites and leukocytes.  This would explain her difficult to localize low back pain.  She has some borderline low blood pressures in the 90s.  She has evidence of acute kidney injury with a creatinine of 1.6, with her weight and prior history of 0.6 creatinine I think this indicates renal failure.  While she is improving with fluids I have discussed that she needs to be admitted for sepsis.  No septic shock at this time.  She has a history of what sounds like anaphylaxis when she was a child with penicillins.  Thus she will be treated with Levaquin.  I discussed the need for admission given all these different findings and she declines at this time.  States she is unable to be admitted.  I discussed possible consequences of not being admitted such as death, organ failure, disability, etc.  She understands these and states she will probably return in 24 hours or less.  However she states she has to go home.  I offered different options to try and take care of whatever is going on at home but she declines.  I discussed that she is able to return at any time and encouraged to do so even if it is just a few minutes from discharge.  She will leave AMA.  I will dose her Levaquin based on her current renal function, which indicates a creatinine clearance of around 22.  Final Clinical Impressions(s) / ED Diagnoses   Final diagnoses:  Sepsis secondary to UTI [facility])  Acute UTI  Hypokalemia  AKI (acute kidney injury) [facility])    ED Discharge Orders        Ordered    levofloxacin (LEVAQUIN) 750 MG tablet  Daily,   Status:  Discontinued     05/16/17 1126    levofloxacin (LEVAQUIN) 750 MG tablet  Every 48 hours     05/16/17 1129    potassium chloride SA (K-DUR,KLOR-CON) 20 MEQ tablet  Daily     05/16/17 1131       Pricilla Loveless, MD 05/16/17 1154

## 2017-05-16 NOTE — Discharge Instructions (Signed)
You have a potentially life threatening infection which is causing fever, sepsis, and kidney failure. I have strongly recommended you be admitted to the hospital for this. We discussed that you could die, have heart, kidney or other organ failure and permanent disability. If you choose to change your mind or come back, know you can do so and are encouraged to at any time. If you don't come back, please see your doctor, as soon as possible, preferably Monday, May 19 2017. Due to the kidney injury/failure, do not take anti-inflammatories such as ibuprofen, advil, aleve, etc as these can worsen kidney disease. Take tylenol for fever and/or pain.

## 2017-05-18 LAB — URINE CULTURE

## 2017-05-19 ENCOUNTER — Telehealth: Payer: Self-pay | Admitting: Emergency Medicine

## 2017-05-19 NOTE — Telephone Encounter (Signed)
Post ED Visit - Positive Culture Follow-up  Culture report reviewed by antimicrobial stewardship pharmacist:  []  Enzo BiNathan Batchelder, Pharm.D. []  Celedonio MiyamotoJeremy Frens, Pharm.D., BCPS AQ-ID []  Garvin FilaMike Maccia, Pharm.D., BCPS []  Georgina PillionElizabeth Martin, Pharm.D., BCPS []  OakdaleMinh Pham, [address] Rainbow BoulevardPharm.D., BCPS, AAHIVP []  Estella HuskMichelle Turner, Pharm.D., BCPS, AAHIVP []  Lysle Pearlachel Rumbarger, PharmD, BCPS []  Casilda Carlsaylor Stone, PharmD, BCPS []  Pollyann SamplesAndy Johnston, PharmD, BCPS  Positive urine culture Treated with levofloxacin, organism sensitive to the same and no further patient follow-up is required at this time.  Berle MullMiller, Audia Amick 05/19/2017, 4:16 PM

## 2017-05-21 LAB — CULTURE, BLOOD (ROUTINE X 2)
Culture: NO GROWTH
Culture: NO GROWTH
Special Requests: ADEQUATE
Special Requests: ADEQUATE

## 2017-05-30 ENCOUNTER — Emergency Department (HOSPITAL_BASED_OUTPATIENT_CLINIC_OR_DEPARTMENT_OTHER)
Admission: EM | Admit: 2017-05-30 | Discharge: 2017-05-30 | Disposition: A | Discharge status: Home / Self Care | Payer: Self-pay | Attending: Emergency Medicine | Admitting: Emergency Medicine

## 2017-05-30 ENCOUNTER — Encounter (HOSPITAL_BASED_OUTPATIENT_CLINIC_OR_DEPARTMENT_OTHER): Payer: Self-pay | Admitting: *Deleted

## 2017-05-30 ENCOUNTER — Other Ambulatory Visit: Payer: Self-pay

## 2017-05-30 DIAGNOSIS — F17210 Nicotine dependence, cigarettes, uncomplicated: Secondary | ICD-10-CM | POA: Insufficient documentation

## 2017-05-30 DIAGNOSIS — Z0000 Encounter for general adult medical examination without abnormal findings: Secondary | ICD-10-CM | POA: Insufficient documentation

## 2017-05-30 DIAGNOSIS — Z9189 Other specified personal risk factors, not elsewhere classified: Secondary | ICD-10-CM | POA: Insufficient documentation

## 2017-05-30 DIAGNOSIS — Z8619 Personal history of other infectious and parasitic diseases: Secondary | ICD-10-CM

## 2017-05-30 DIAGNOSIS — Z79899 Other long term (current) drug therapy: Secondary | ICD-10-CM | POA: Insufficient documentation

## 2017-05-30 DIAGNOSIS — J449 Chronic obstructive pulmonary disease, unspecified: Secondary | ICD-10-CM | POA: Insufficient documentation

## 2017-05-30 DIAGNOSIS — I10 Essential (primary) hypertension: Secondary | ICD-10-CM | POA: Insufficient documentation

## 2017-05-30 NOTE — Discharge Instructions (Signed)
It was our pleasure to provide your ER care today.  It looks that your infection has resolved.   Return to ER if worse, new symptoms, fevers, other concern.

## 2017-05-30 NOTE — ED Triage Notes (Signed)
Pt states here for blood recheck ,AMA 11/16 DX sepsis

## 2017-05-30 NOTE — ED Provider Notes (Signed)
[facility] EMERGENCY DEPARTMENT Provider Note   CSN: [id number] Arrival date & time: 05/30/17  0746     History   Chief Complaint Chief Complaint  Patient presents with  . Follow-up    HPI Annette Palmer is a 61 y.o. female.  Patient presents for recheck - states she had urine infection two weeks ago.  Patient states in past week has been feeling normal. No fevers. Normal appetite. Good po intake. Denies dysuria or any g/u c/o. No abd or flank pain. No chills or sweats.  States she completed the course of her antibiotic, and is currently asymptomatic.    The history is provided by the patient.    Past Medical History:  Diagnosis Date  . Anxiety   . Chest pain   . COPD (chronic obstructive pulmonary disease) (HCC)   . Depression   . Edema    lower extremities up to her knee  . Hemarthrosis involving knee joint   . Hemoptysis   . Hypertension   . Joint pain   . Kidney stone   . Nausea     Patient Active Problem List   Diagnosis Date Noted  . COPD (chronic obstructive pulmonary disease) (HCC) 03/31/2012  . Tobacco abuse 03/31/2012    Past Surgical History:  Procedure Laterality Date  . ABDOMINAL HYSTERECTOMY  2010  . APPENDECTOMY  2010  . COLON SURGERY  2011  . KNEE SURGERY     left  . TUBAL LIGATION      OB History    No data available       Home Medications    Prior to Admission medications   Medication Sig Start Date End Date Taking? Authorizing Provider  albuterol (PROVENTIL HFA;VENTOLIN HFA) 108 (90 BASE) MCG/ACT inhaler Inhale into the lungs every 6 (six) hours as needed. Patient uses this medication for shortness of breath.    [provider]  ALPRAZolam Prudy Feeler(XANAX) 1 MG tablet Take 1 mg by mouth at bedtime.    [provider]  Aspirin-Salicylamide-Caffeine (BC HEADACHE POWDER PO) Take by mouth as needed. For HA    [provider]  BEE POLLEN PO Take 2 capsules by mouth daily.     [provider]    clonazePAM (KLONOPIN) 0.5 MG tablet Take 1.5 mg by mouth as needed.    [provider]  Diphenhydramine-APAP, sleep, (GOODY PM PO) Take by mouth at bedtime.    [provider]  ibuprofen (ADVIL,MOTRIN) 200 MG tablet Take 800 mg by mouth 2 (two) times daily as needed. For pain    [provider]  potassium chloride SA (K-DUR,KLOR-CON) 20 MEQ tablet Take 1 tablet (20 mEq total) daily by mouth. 05/16/17   Pricilla LovelessGoldston, Scott, MD  predniSONE (DELTASONE) 10 MG tablet Take 4 tabs  daily with food x 4 days, then 3 tabs daily x 4 days, then 2 tabs daily x 4 days, then 1 tab daily x4 days then stop 03/31/12   Oretha MilchAlva, Rakesh V, MD  sertraline (ZOLOFT) 100 MG tablet Take 50 mg by mouth daily.     [provider]    Family History Family History  Problem Relation Age of Onset  . Diabetes type I Mother   . Hypertension Mother   . Hyperlipidemia Unknown     Social History Social History   Tobacco Use  . Smoking status: Current Every Day Smoker    Packs/day: 1.00    Types: Cigarettes  . Smokeless tobacco: Never Used  . Tobacco  comment: Start smoking at age 61.  Currently smoking 1 ppd.    Substance Use Topics  . Alcohol use: No    Comment: quit drinking Feb 10 , 2013  . Drug use: No     Allergies   Penicillins; Sulfa drugs cross reactors; and Zofran [ondansetron hcl]   Review of Systems Review of Systems  Constitutional: Negative for chills and fever.  Gastrointestinal: Negative for abdominal pain, nausea and vomiting.  Genitourinary: Negative for dysuria and flank pain.  Musculoskeletal: Negative for back pain.  Skin: Negative for rash.  Neurological: Negative for headaches.     Physical Exam Updated Vital Signs BP (!) 162/92   Pulse 86   Temp 97.7 F (36.5 C)   Resp 16   Ht 1.676 m (5\' 6" )   Wt 38.6 kg (85 lb)   SpO2 100%   BMI 13.72 kg/m   Physical Exam  Constitutional: She appears well-developed and well-nourished. No distress.  HENT:   Mouth/Throat: Oropharynx is clear and moist.  Eyes: Conjunctivae are normal. No scleral icterus.  Neck: Neck supple. No tracheal deviation present.  Cardiovascular: Normal rate.  Pulmonary/Chest: Effort normal and breath sounds normal. No respiratory distress.  Abdominal: Soft. Normal appearance. She exhibits no distension. There is no tenderness.  Genitourinary:  Genitourinary Comments: No cva tenderness  Musculoskeletal: She exhibits no edema.  Neurological: She is alert.  Skin: Skin is warm and dry. No rash noted.  Psychiatric: She has a normal mood and affect.  Nursing note and vitals reviewed.    ED Treatments / Results  Labs (all labs ordered are listed, but only abnormal results are displayed) Labs Reviewed - No data to display  EKG  EKG Interpretation None       Radiology No results found.  Procedures Procedures (including critical care time)  Medications Ordered in ED Medications - No data to display   Initial Impression / Assessment and Plan / ED Course  I have reviewed the triage vital signs and the nursing notes.  Pertinent labs & imaging results that were available during my care of the patient were reviewed by me and considered in my medical decision making (see chart for details).  Patient is completely asymptomatic s/p tx for uti.   Her urine culture was positive, and sensitive to abx she prescribed, and completed the course.  Patient appears stable for d/c.     Final Clinical Impressions(s) / ED Diagnoses   Final diagnoses:  None    ED Discharge Orders    None       Cathren LaineSteinl, Jalaila Caradonna, MD 05/30/17 [phone]

## 2018-12-04 ENCOUNTER — Encounter (HOSPITAL_BASED_OUTPATIENT_CLINIC_OR_DEPARTMENT_OTHER): Payer: Self-pay | Admitting: Emergency Medicine

## 2018-12-04 ENCOUNTER — Emergency Department (HOSPITAL_BASED_OUTPATIENT_CLINIC_OR_DEPARTMENT_OTHER): Payer: Self-pay

## 2018-12-04 ENCOUNTER — Other Ambulatory Visit: Payer: Self-pay

## 2018-12-04 ENCOUNTER — Emergency Department (HOSPITAL_BASED_OUTPATIENT_CLINIC_OR_DEPARTMENT_OTHER)
Admission: EM | Admit: 2018-12-04 | Discharge: 2018-12-04 | Disposition: A | Discharge status: Home / Self Care | Payer: Self-pay | Attending: Emergency Medicine | Admitting: Emergency Medicine

## 2018-12-04 DIAGNOSIS — J449 Chronic obstructive pulmonary disease, unspecified: Secondary | ICD-10-CM | POA: Insufficient documentation

## 2018-12-04 DIAGNOSIS — R1084 Generalized abdominal pain: Secondary | ICD-10-CM | POA: Insufficient documentation

## 2018-12-04 DIAGNOSIS — Z79899 Other long term (current) drug therapy: Secondary | ICD-10-CM | POA: Insufficient documentation

## 2018-12-04 DIAGNOSIS — F17210 Nicotine dependence, cigarettes, uncomplicated: Secondary | ICD-10-CM | POA: Insufficient documentation

## 2018-12-04 DIAGNOSIS — I10 Essential (primary) hypertension: Secondary | ICD-10-CM | POA: Insufficient documentation

## 2018-12-04 DIAGNOSIS — R109 Unspecified abdominal pain: Secondary | ICD-10-CM

## 2018-12-04 LAB — URINALYSIS, ROUTINE W REFLEX MICROSCOPIC
Bilirubin Urine: NEGATIVE
Glucose, UA: NEGATIVE mg/dL
Hgb urine dipstick: NEGATIVE
Ketones, ur: NEGATIVE mg/dL
Leukocytes,Ua: NEGATIVE
Nitrite: NEGATIVE
Protein, ur: NEGATIVE mg/dL
Specific Gravity, Urine: 1.01 (ref 1.005–1.030)
pH: 6 (ref 5.0–8.0)

## 2018-12-04 LAB — CBC WITH DIFFERENTIAL/PLATELET
Abs Immature Granulocytes: 0.05 10*3/uL (ref 0.00–0.07)
Basophils Absolute: 0 10*3/uL (ref 0.0–0.1)
Basophils Relative: 0 %
Eosinophils Absolute: 0.1 10*3/uL (ref 0.0–0.5)
Eosinophils Relative: 2 %
HCT: 40.4 % (ref 36.0–46.0)
Hemoglobin: 13.8 g/dL (ref 12.0–15.0)
Immature Granulocytes: 1 %
Lymphocytes Relative: 27 %
Lymphs Abs: 2 10*3/uL (ref 0.7–4.0)
MCH: 32 pg (ref 26.0–34.0)
MCHC: 34.2 g/dL (ref 30.0–36.0)
MCV: 93.7 fL (ref 80.0–100.0)
Monocytes Absolute: 0.6 10*3/uL (ref 0.1–1.0)
Monocytes Relative: 8 %
Neutro Abs: 4.4 10*3/uL (ref 1.7–7.7)
Neutrophils Relative %: 62 %
Platelets: 183 10*3/uL (ref 150–400)
RBC: 4.31 MIL/uL (ref 3.87–5.11)
RDW: 11.9 % (ref 11.5–15.5)
WBC: 7.2 10*3/uL (ref 4.0–10.5)
nRBC: 0 % (ref 0.0–0.2)

## 2018-12-04 LAB — COMPREHENSIVE METABOLIC PANEL
ALT: 23 U/L (ref 0–44)
AST: 25 U/L (ref 15–41)
Albumin: 4.4 g/dL (ref 3.5–5.0)
Alkaline Phosphatase: 48 U/L (ref 38–126)
Anion gap: 8 (ref 5–15)
BUN: 21 mg/dL — ABNORMAL HIGH (ref 6–20)
CO2: 24 mmol/L (ref 22–32)
Calcium: 9.2 mg/dL (ref 8.9–10.3)
Chloride: 110 mmol/L (ref 98–111)
Creatinine, Ser: 0.75 mg/dL (ref 0.44–1.00)
GFR calc Af Amer: >60 mL/min (ref >60)
GFR calc non Af Amer: >60 mL/min (ref >60)
Glucose, Bld: 99 mg/dL (ref 70–99)
Potassium: 3.9 mmol/L (ref 3.5–5.1)
Sodium: 142 mmol/L (ref 135–145)
Total Bilirubin: 0.6 mg/dL (ref 0.3–1.2)
Total Protein: 7.5 g/dL (ref 6.5–8.1)

## 2018-12-04 LAB — TROPONIN I: Troponin I: <0.03 ng/mL (ref <0.03)

## 2018-12-04 LAB — LIPASE, BLOOD: Lipase: 58 U/L — ABNORMAL HIGH (ref 11–51)

## 2018-12-04 MED ORDER — SODIUM CHLORIDE 0.9 % IV SOLN
INTRAVENOUS | Status: DC
  Administered 2018-12-04: 09:00:00 via INTRAVENOUS

## 2018-12-04 MED ORDER — AMLODIPINE BESYLATE 2.5 MG PO TABS: 2.5000 mg | ORAL_TABLET | Freq: Every day | ORAL | 0 refills | Status: DC

## 2018-12-04 MED ORDER — AMLODIPINE BESYLATE 5 MG PO TABS
5.0000 mg | ORAL_TABLET | Freq: Once | ORAL | Status: AC
  Administered 2018-12-04: 5 mg via ORAL
  Filled 2018-12-04: qty 1

## 2018-12-04 MED ORDER — PROMETHAZINE HCL 25 MG/ML IJ SOLN
12.5000 mg | Freq: Once | INTRAMUSCULAR | Status: AC
Start: 2018-12-04 — End: 2018-12-04
  Administered 2018-12-04: 12.5 mg via INTRAVENOUS
  Filled 2018-12-04: qty 1

## 2018-12-04 MED ORDER — ACETAMINOPHEN 325 MG PO TABS
650.0000 mg | ORAL_TABLET | Freq: Once | ORAL | Status: AC
  Administered 2018-12-04: 650 mg via ORAL
  Filled 2018-12-04: qty 2

## 2018-12-04 MED ORDER — SODIUM CHLORIDE 0.9 % IV BOLUS
1000.0000 mL | Freq: Once | INTRAVENOUS | Status: AC
  Administered 2018-12-04: 1000 mL via INTRAVENOUS

## 2018-12-04 MED FILL — AMLODIPINE 2.5 MG TABLET: 2.5 | 14 days supply | Qty: 14 | Fill #0

## 2018-12-04 NOTE — ED Provider Notes (Signed)
[facility] EMERGENCY DEPARTMENT Provider Note   CSN: [id number] Arrival date & time: 12/04/18  4098    History   Chief Complaint Chief Complaint  Patient presents with   Abdominal Pain    HPI Annette Palmer is a 63 y.o. female.     HPI Patient presents to the ED with numerous complaints.  Patient states her primary issue is her abdominal pain.  She has been having this for months.  She knows it is her gallbladder because she has spitting up a lot.  When she took her very grandmother who had gallbladder disease she had that same issue.  Patient states it hurts in her upper abdomen's and also in her lower abdomen.  She also knows she has chronic kidney disease and this is causing her a lot of pain.  Patient states this morning she had an episode where she choked on tater tots and water.  She has had chronic nausea and diarrhea for the last couple months.  Patient also states she is having trouble with headaches on both sides and her nerves are acting up.  She wants something for her nerves.  Patient states she has not seen anyone for this since it started.  Patient was noted to be hypertensive here and when I asked her about her blood pressure, patient states her doctor took her off blood pressure medications because her blood pressure was fine.   Past Medical History:  Diagnosis Date   Anxiety    Chest pain    COPD (chronic obstructive pulmonary disease) (HCC)    Depression    Edema    lower extremities up to her knee   Hemarthrosis involving knee joint    Hemoptysis    Hypertension    Joint pain    Kidney stone    Nausea     Patient Active Problem List   Diagnosis Date Noted   COPD (chronic obstructive pulmonary disease) (HCC) 03/31/2012   Tobacco abuse 03/31/2012    Past Surgical History:  Procedure Laterality Date   ABDOMINAL HYSTERECTOMY  2010   APPENDECTOMY  2010   COLON SURGERY  2011   KNEE SURGERY     left   TUBAL LIGATION        OB History   No obstetric history on file.      Home Medications    Prior to Admission medications   Medication Sig Start Date End Date Taking? Authorizing Provider  albuterol (PROVENTIL HFA;VENTOLIN HFA) 108 (90 BASE) MCG/ACT inhaler Inhale into the lungs every 6 (six) hours as needed. Patient uses this medication for shortness of breath.    [provider]  ALPRAZolam Prudy Feeler) 1 MG tablet Take 1 mg by mouth at bedtime.    [provider]  amLODipine (NORVASC) 2.5 MG tablet Take 1 tablet (2.5 mg total) by mouth daily. 12/04/18   Linwood Dibbles, MD  Aspirin-Salicylamide-Caffeine [facility] HEADACHE POWDER PO) Take by mouth as needed. For HA    [provider]  BEE POLLEN PO Take 2 capsules by mouth daily.     [provider]  clonazePAM (KLONOPIN) 0.5 MG tablet Take 1.5 mg by mouth as needed.    [provider]  Diphenhydramine-APAP, sleep, (GOODY PM PO) Take by mouth at bedtime.    [provider]  ibuprofen (ADVIL,MOTRIN) 200 MG tablet Take 800 mg by mouth 2 (two) times daily as needed. For pain    [provider]  potassium chloride SA (K-DUR,KLOR-CON) 20 MEQ tablet  Take 1 tablet (20 mEq total) daily by mouth. 05/16/17   Pricilla LovelessGoldston, Scott, MD  predniSONE (DELTASONE) 10 MG tablet Take 4 tabs  daily with food x 4 days, then 3 tabs daily x 4 days, then 2 tabs daily x 4 days, then 1 tab daily x4 days then stop 03/31/12   Oretha MilchAlva, Rakesh V, MD  sertraline (ZOLOFT) 100 MG tablet Take 50 mg by mouth daily.     [provider]    Family History Family History  Problem Relation Age of Onset   Diabetes type I Mother    Hypertension Mother    Hyperlipidemia Other     Social History Social History   Tobacco Use   Smoking status: Current Every Day Smoker    Packs/day: 1.00    Types: Cigarettes   Smokeless tobacco: Never Used   Tobacco comment: Start smoking at age 63.  Currently smoking 1 ppd.    Substance Use Topics    Alcohol use: No    Comment: quit drinking Feb 10 , 2013   Drug use: No     Allergies   Penicillins; Sulfa drugs cross reactors; and Zofran [ondansetron hcl]   Review of Systems Review of Systems  All other systems reviewed and are negative.    Physical Exam Updated Vital Signs BP 115/72    Pulse 64    Temp 98.9 F (37.2 C) (Oral)    Resp 17    Ht 1.676 m (5\' 6" )    Wt 40.8 kg    SpO2 100%    BMI 14.53 kg/m   Physical Exam Vitals signs and nursing note reviewed.  Constitutional:      General: She is not in acute distress.    Appearance: She is well-developed. She is not ill-appearing or toxic-appearing.  HENT:     Head: Normocephalic and atraumatic.     Comments: Skull palpated, no abnormalities noted    Right Ear: External ear normal.     Left Ear: External ear normal.  Eyes:     General: No scleral icterus.       Right eye: No discharge.        Left eye: No discharge.     Conjunctiva/sclera: Conjunctivae normal.  Neck:     Musculoskeletal: Neck supple.     Trachea: No tracheal deviation.  Cardiovascular:     Rate and Rhythm: Normal rate and regular rhythm.  Pulmonary:     Effort: Pulmonary effort is normal. No respiratory distress.     Breath sounds: Normal breath sounds. No stridor. No wheezing or rales.  Abdominal:     General: Bowel sounds are normal. There is no distension.     Palpations: Abdomen is soft.     Tenderness: There is generalized abdominal tenderness. There is no guarding or rebound.  Musculoskeletal:        General: No tenderness.  Skin:    General: Skin is warm and dry.     Findings: No rash.  Neurological:     Mental Status: She is alert.     Cranial Nerves: No cranial nerve deficit (no facial droop, extraocular movements intact, no slurred speech).     Sensory: No sensory deficit.     Motor: No abnormal muscle tone or seizure activity.     Coordination: Coordination normal.  Psychiatric:        Mood and Affect: Affect is not blunt,  flat, angry or tearful.        Speech: Speech is  tangential.      ED Treatments / Results  Labs (all labs ordered are listed, but only abnormal results are displayed) Labs Reviewed  COMPREHENSIVE METABOLIC PANEL - Abnormal; Notable for the following components:      Result Value   BUN 21 (*)    All other components within normal limits  LIPASE, BLOOD - Abnormal; Notable for the following components:   Lipase 58 (*)    All other components within normal limits  URINALYSIS, ROUTINE W REFLEX MICROSCOPIC - Abnormal; Notable for the following components:   Color, Urine STRAW (*)    All other components within normal limits  CBC WITH DIFFERENTIAL/PLATELET  TROPONIN I    EKG EKG Interpretation  Date/Time:  Friday December 04 2018 07:39:28 EDT Ventricular Rate:  78 PR Interval:    QRS Duration: 94 QT Interval:  374 QTC Calculation: 426 R Axis:   75 Text Interpretation:  Sinus rhythm Borderline short PR interval repolarization changes decreased compared to prior Confirmed by Linwood Dibbles [phone]) on 12/04/2018 7:48:32 AM   Radiology US Abdomen Complete  Result Date: 12/04/2018 CLINICAL DATA:  Abdominal pain. EXAM: ABDOMEN ULTRASOUND COMPLETE COMPARISON:  Abdomen series 12/04/2018.  CT 2017. FINDINGS: Gallbladder: No gallstones. Gallbladder wall thickness 1.7 mm. Negative Murphy sign. Common bile duct: Diameter: 5.8 mm Liver: Mild increased echogenicity consistent fatty infiltration or hepatocellular disease. Portal vein is patent on color Doppler imaging with normal direction of blood flow towards the liver. IVC: No abnormality visualized. Pancreas: Visualized portion unremarkable. Spleen: Spleen is prominent at 13.2 cm with a volume of 473 cc. Right Kidney: Length: 8.9 cm. Echogenicity within normal limits. No mass or hydronephrosis visualized. Left Kidney: Length: 9.9 cm. Echogenicity within normal limits. 1.1 cm simple cyst. No hydronephrosis visualized. Abdominal aorta: No aneurysm  visualized. Other findings: None. IMPRESSION: 1. No gallstones or biliary distention. No acute abnormality identified. 2. Mild increased hepatic echogenicity suggesting fatty infiltration. Spleen is prominent at 13.2 cm with a volume of 473 cc. Electronically Signed   By: Maisie Fus  Register   On: 12/04/2018 10:09   Dg Abdomen Acute W/chest  Result Date: 12/04/2018 CLINICAL DATA:  Diarrhea and chest pain for 2 months. Dizziness. Cigarette smoker. EXAM: DG ABDOMEN ACUTE W/ 1V CHEST COMPARISON:  PA and lateral chest 05/06/2017.  CT chest 12/31/2011. FINDINGS: The lungs are markedly emphysematous but clear. Heart size is normal. No pneumothorax or pleural effusion. No acute or focal bony abnormality. Atherosclerosis noted. Two views of the abdomen show no free intraperitoneal air. The bowel gas pattern is normal. No acute or focal bony abnormality. IMPRESSION: No acute finding chest or abdomen. Marked emphysema. Electronically Signed   By: Drusilla Kanner M.D.   On: 12/04/2018 08:49    Procedures Procedures (including critical care time)  Medications Ordered in ED Medications  sodium chloride 0.9 % bolus 1,000 mL (0 mLs Intravenous Stopped 12/04/18 0844)    And  0.9 %  sodium chloride infusion ( Intravenous New Bag/Given 12/04/18 0845)  amLODipine (NORVASC) tablet 5 mg (5 mg Oral Given 12/04/18 0734)  promethazine (PHENERGAN) injection 12.5 mg (12.5 mg Intravenous Given 12/04/18 0750)  acetaminophen (TYLENOL) tablet 650 mg (650 mg Oral Given 12/04/18 0734)     Initial Impression / Assessment and Plan / ED Course  I have reviewed the triage vital signs and the nursing notes.  Pertinent labs & imaging results that were available during my care of the patient were reviewed by me and considered in my medical decision making (see  chart for details).  Clinical Course as of Dec 04 1035  Fri Dec 04, 2018  0737 BP noted to be 170s/97 at a PCP visit in June 2019.    [JK]  [id number] Patient states her symptoms are  improving.   [JK]    Clinical Course User Index [JK] Linwood Dibbles, MD     Patient presented to the emergency room for evaluation of various complaints but primarily was complaining of abdominal pain.  On exam the patient did have some focal tenderness but otherwise appeared well and her exam was benign.  There is no guarding or rebound.  Her laboratory tests are reassuring.  She does have a slight increase in the lipase but I do not think this is clinically significant.  EKG and cardiac enzymes are unremarkable.  I doubt this is referred cardiac pain.  Patient's ultrasound does not show any acute abnormalities.  No signs of aneurysm, cholecystitis or other abnormalities.  At this point I do not see any evidence of any acute emergency condition.  I recommend follow-up with her primary care doctor.  Patient was hypertensive initially.  Her blood pressure improved with normal dose of Norvasc.  She was given 5 mg but I will give her prescription for 2.5 mg as she had a good response.  Patient has plans to see her primary care doctor this month on the 16th.  Final Clinical Impressions(s) / ED Diagnoses   Final diagnoses:  Abdominal pain, unspecified abdominal location  Hypertension, unspecified type    ED Discharge Orders         Ordered    amLODipine (NORVASC) 2.5 MG tablet  Daily     12/04/18 1036           Linwood Dibbles, MD 12/04/18 1039

## 2018-12-04 NOTE — ED Notes (Signed)
Ambulated to bathroom with steady gait.  Specimen collected 

## 2018-12-04 NOTE — ED Triage Notes (Signed)
Pt arrives via EMS with multiple complaints. C/o low abd pain for months, diarrhea, chest pain, memory loss. She states she needs oxygen because she is suffocating. Pt in no distress.

## 2018-12-04 NOTE — Discharge Instructions (Addendum)
Follow-up with your doctor later this month as planned.  Discussed further work-up and possible gastroenterology referral if your symptoms persist.  Start taking the blood pressure medication as we discussed and have your blood pressure rechecked at the next visit.

## 2018-12-04 NOTE — ED Notes (Signed)
Pt verbalized understanding of dc instructions.

## 2018-12-06 ENCOUNTER — Other Ambulatory Visit: Payer: Self-pay

## 2018-12-06 ENCOUNTER — Emergency Department (HOSPITAL_BASED_OUTPATIENT_CLINIC_OR_DEPARTMENT_OTHER)
Admission: EM | Admit: 2018-12-06 | Discharge: 2018-12-07 | Disposition: A | Discharge status: Home / Self Care | Payer: Self-pay | Attending: Emergency Medicine | Admitting: Emergency Medicine

## 2018-12-06 ENCOUNTER — Encounter (HOSPITAL_BASED_OUTPATIENT_CLINIC_OR_DEPARTMENT_OTHER): Payer: Self-pay | Admitting: *Deleted

## 2018-12-06 ENCOUNTER — Emergency Department (HOSPITAL_BASED_OUTPATIENT_CLINIC_OR_DEPARTMENT_OTHER)
Admission: EM | Admit: 2018-12-06 | Discharge: 2018-12-06 | Disposition: A | Discharge status: Home / Self Care | Payer: Self-pay | Attending: Emergency Medicine | Admitting: Emergency Medicine

## 2018-12-06 ENCOUNTER — Emergency Department (HOSPITAL_BASED_OUTPATIENT_CLINIC_OR_DEPARTMENT_OTHER): Payer: Self-pay

## 2018-12-06 DIAGNOSIS — I10 Essential (primary) hypertension: Secondary | ICD-10-CM | POA: Insufficient documentation

## 2018-12-06 DIAGNOSIS — R110 Nausea: Secondary | ICD-10-CM | POA: Insufficient documentation

## 2018-12-06 DIAGNOSIS — R109 Unspecified abdominal pain: Secondary | ICD-10-CM | POA: Insufficient documentation

## 2018-12-06 DIAGNOSIS — Z79899 Other long term (current) drug therapy: Secondary | ICD-10-CM | POA: Insufficient documentation

## 2018-12-06 DIAGNOSIS — F17210 Nicotine dependence, cigarettes, uncomplicated: Secondary | ICD-10-CM | POA: Insufficient documentation

## 2018-12-06 DIAGNOSIS — R112 Nausea with vomiting, unspecified: Secondary | ICD-10-CM | POA: Insufficient documentation

## 2018-12-06 DIAGNOSIS — R350 Frequency of micturition: Secondary | ICD-10-CM | POA: Insufficient documentation

## 2018-12-06 DIAGNOSIS — J449 Chronic obstructive pulmonary disease, unspecified: Secondary | ICD-10-CM | POA: Insufficient documentation

## 2018-12-06 DIAGNOSIS — R197 Diarrhea, unspecified: Secondary | ICD-10-CM | POA: Insufficient documentation

## 2018-12-06 DIAGNOSIS — Z880 Allergy status to penicillin: Secondary | ICD-10-CM | POA: Insufficient documentation

## 2018-12-06 LAB — URINALYSIS, ROUTINE W REFLEX MICROSCOPIC
Bilirubin Urine: NEGATIVE
Bilirubin Urine: NEGATIVE
Glucose, UA: NEGATIVE mg/dL
Glucose, UA: NEGATIVE mg/dL
Hgb urine dipstick: NEGATIVE
Ketones, ur: NEGATIVE mg/dL
Ketones, ur: NEGATIVE mg/dL
Leukocytes,Ua: NEGATIVE
Leukocytes,Ua: NEGATIVE
Nitrite: NEGATIVE
Nitrite: NEGATIVE
Protein, ur: NEGATIVE mg/dL
Protein, ur: NEGATIVE mg/dL
Specific Gravity, Urine: 1.01 (ref 1.005–1.030)
Specific Gravity, Urine: 1.025 (ref 1.005–1.030)
pH: 6 (ref 5.0–8.0)
pH: 6 (ref 5.0–8.0)

## 2018-12-06 LAB — URINALYSIS, MICROSCOPIC (REFLEX): Bacteria, UA: NONE SEEN

## 2018-12-06 LAB — BASIC METABOLIC PANEL
Anion gap: 5 (ref 5–15)
BUN: 19 mg/dL (ref 6–20)
CO2: 24 mmol/L (ref 22–32)
Calcium: 9.2 mg/dL (ref 8.9–10.3)
Chloride: 111 mmol/L (ref 98–111)
Creatinine, Ser: 0.97 mg/dL (ref 0.44–1.00)
GFR calc Af Amer: >60 mL/min (ref >60)
GFR calc non Af Amer: >60 mL/min (ref >60)
Glucose, Bld: 84 mg/dL (ref 70–99)
Potassium: 4.2 mmol/L (ref 3.5–5.1)
Sodium: 140 mmol/L (ref 135–145)

## 2018-12-06 LAB — CBC
HCT: 38.7 % (ref 36.0–46.0)
Hemoglobin: 12.9 g/dL (ref 12.0–15.0)
MCH: 31.9 pg (ref 26.0–34.0)
MCHC: 33.3 g/dL (ref 30.0–36.0)
MCV: 95.6 fL (ref 80.0–100.0)
Platelets: 183 10*3/uL (ref 150–400)
RBC: 4.05 MIL/uL (ref 3.87–5.11)
RDW: 11.9 % (ref 11.5–15.5)
WBC: 6.1 10*3/uL (ref 4.0–10.5)
nRBC: 0 % (ref 0.0–0.2)

## 2018-12-06 MED ORDER — KETOROLAC TROMETHAMINE 30 MG/ML IJ SOLN
30.0000 mg | Freq: Once | INTRAMUSCULAR | Status: AC
  Administered 2018-12-06: 30 mg via INTRAVENOUS
  Filled 2018-12-06: qty 1

## 2018-12-06 MED ORDER — PROMETHAZINE HCL 25 MG/ML IJ SOLN
12.5000 mg | Freq: Once | INTRAMUSCULAR | Status: AC
  Administered 2018-12-06: 12.5 mg via INTRAVENOUS
  Filled 2018-12-06: qty 1

## 2018-12-06 MED ORDER — PROMETHAZINE HCL 25 MG/ML IJ SOLN
12.5000 mg | Freq: Once | INTRAMUSCULAR | Status: AC
  Administered 2018-12-06: 12.5 mg via INTRAVENOUS

## 2018-12-06 MED ORDER — SODIUM CHLORIDE 0.9 % IV BOLUS
500.0000 mL | Freq: Once | INTRAVENOUS | Status: AC
  Administered 2018-12-06: 500 mL via INTRAVENOUS

## 2018-12-06 MED ORDER — NAPROXEN 500 MG PO TABS: 500.0000 mg | ORAL_TABLET | Freq: Two times a day (BID) | ORAL | 0 refills | Status: DC

## 2018-12-06 MED ORDER — PROMETHAZINE HCL 25 MG/ML IJ SOLN
INTRAMUSCULAR | Status: AC
  Filled 2018-12-06: qty 1

## 2018-12-06 MED ORDER — FENTANYL CITRATE (PF) 100 MCG/2ML IJ SOLN
25.0000 ug | Freq: Once | INTRAMUSCULAR | Status: AC
  Administered 2018-12-06: 25 ug via INTRAVENOUS
  Filled 2018-12-06: qty 2

## 2018-12-06 NOTE — ED Triage Notes (Signed)
Pt reports right side Flank pain since noon. Endorses nausea. States "I have a kidney stone and it's moving"

## 2018-12-06 NOTE — ED Provider Notes (Signed)
[facility] EMERGENCY DEPARTMENT Provider Note   CSN: [id number] Arrival date & time: 12/06/18  1729    History   Chief Complaint Chief Complaint  Patient presents with  . Flank Pain    HPI Annette Palmer is a 63 y.o. female w/ a hx of tobacco abuse, COPD, HTN, appendectomy, abdominal hysterectomy, & nephrolithiasis who presents to the ED with complaints of right flank pain that began @ 12:00 this afternoon.  Patient states pain is located in the right flank, nonradiating, waxing/waning in severity, currently a 10 out of 10.  No specific alleviating or aggravating factors.  No intervention prior to arrival.  She has had associated urinary frequency and nausea without vomiting. Denies fever, chills, dysuria, hematuria, or melena.  Patient has had issues with abdominal pain and diarrhea over the past couple of months, evaluated for this in the emergency department 2 days prior, no acute change.  She states that her current symptoms feel exactly the same as prior kidney stones.  She has not required surgical intervention for kidney stones in the past.    HPI  Past Medical History:  Diagnosis Date  . Anxiety   . Chest pain   . COPD (chronic obstructive pulmonary disease) ([place])   . Depression   . Edema    lower extremities up to her knee  . Hemarthrosis involving knee joint   . Hemoptysis   . Hypertension   . Joint pain   . Kidney stone   . Nausea     Patient Active Problem List   Diagnosis Date Noted  . COPD (chronic obstructive pulmonary disease) ([place]) 03/31/2012  . Tobacco abuse 03/31/2012    Past Surgical History:  Procedure Laterality Date  . ABDOMINAL HYSTERECTOMY  2010  . APPENDECTOMY  2010  . COLON SURGERY  2011  . KNEE SURGERY     left  . TUBAL LIGATION       OB History   No obstetric history on file.      Home Medications    Prior to Admission medications   Medication Sig Start Date End Date Taking? Authorizing Provider  albuterol  (PROVENTIL HFA;VENTOLIN HFA) 108 (90 BASE) MCG/ACT inhaler Inhale into the lungs every 6 (six) hours as needed. Patient uses this medication for shortness of breath.    [provider]  ALPRAZolam Duanne Moron) 1 MG tablet Take 1 mg by mouth at bedtime.    [provider]  amLODipine (NORVASC) 2.5 MG tablet Take 1 tablet (2.5 mg total) by mouth daily. 12/04/18   Dorie Rank, MD  Aspirin-Salicylamide-Caffeine [facility] HEADACHE POWDER PO) Take by mouth as needed. For HA    [provider]  BEE POLLEN PO Take 2 capsules by mouth daily.     [provider]  clonazePAM (KLONOPIN) 0.5 MG tablet Take 1.5 mg by mouth as needed.    [provider]  Diphenhydramine-APAP, sleep, (GOODY PM PO) Take by mouth at bedtime.    [provider]  ibuprofen (ADVIL,MOTRIN) 200 MG tablet Take 800 mg by mouth 2 (two) times daily as needed. For pain    [provider]  potassium chloride SA (K-DUR,KLOR-CON) 20 MEQ tablet Take 1 tablet (20 mEq total) daily by mouth. 05/16/17   Sherwood Gambler, MD  predniSONE (DELTASONE) 10 MG tablet Take 4 tabs  daily with food x 4 days, then 3 tabs daily x 4 days, then 2 tabs daily x 4 days, then 1 tab daily x4 days then stop 03/31/12  Oretha MilchAlva, Rakesh V, MD  sertraline (ZOLOFT) 100 MG tablet Take 50 mg by mouth daily.     [provider]    Family History Family History  Problem Relation Age of Onset  . Diabetes type I Mother   . Hypertension Mother   . Hyperlipidemia Other     Social History Social History   Tobacco Use  . Smoking status: Current Every Day Smoker    Packs/day: 1.00    Types: Cigarettes  . Smokeless tobacco: Never Used  . Tobacco comment: Start smoking at age 63.  Currently smoking 1 ppd.    Substance Use Topics  . Alcohol use: Not Currently    Comment: quit drinking Feb 10 , 2013  . Drug use: No     Allergies   Penicillins; Sulfa drugs cross reactors; and Zofran [ondansetron hcl]   Review of  Systems Review of Systems  Constitutional: Negative for chills and fever.  Respiratory: Negative for shortness of breath.   Cardiovascular: Negative for chest pain.  Gastrointestinal: Positive for abdominal pain, diarrhea and nausea. Negative for anal bleeding, blood in stool and constipation.  Genitourinary: Positive for flank pain and frequency. Negative for dysuria, hematuria, vaginal bleeding and vaginal discharge.  All other systems reviewed and are negative.    Physical Exam Updated Vital Signs BP (!) 103/5 (BP Location: Right Arm)   Pulse 92   Temp 98.2 F (36.8 C) (Oral)   Resp 16   Ht 5\' 6"  (1.676 m)   Wt 42.6 kg   SpO2 100%   BMI 15.17 kg/m   Physical Exam Vitals signs and nursing note reviewed.  Constitutional:      General: She is not in acute distress.    Appearance: She is well-developed. She is not toxic-appearing.  HENT:     Head: Normocephalic and atraumatic.  Eyes:     General:        Right eye: No discharge.        Left eye: No discharge.     Conjunctiva/sclera: Conjunctivae normal.  Neck:     Musculoskeletal: Neck supple.  Cardiovascular:     Rate and Rhythm: Normal rate and regular rhythm.     Comments: 2+ symmetric radial & DP pulses.  Pulmonary:     Effort: Pulmonary effort is normal. No respiratory distress.     Breath sounds: Normal breath sounds. No wheezing, rhonchi or rales.  Abdominal:     General: There is no distension.     Palpations: Abdomen is soft.     Tenderness: There is abdominal tenderness (mild generalized, no focal tenderness). There is right CVA tenderness. There is no guarding or rebound.  Skin:    General: Skin is warm and dry.     Findings: No rash.  Neurological:     General: No focal deficit present.     Mental Status: She is alert.     Comments: Clear speech.   Psychiatric:        Behavior: Behavior normal.    ED Treatments / Results  Labs (all labs ordered are listed, but only abnormal results are displayed)  Labs Reviewed  URINALYSIS, ROUTINE W REFLEX MICROSCOPIC  CBC  BASIC METABOLIC PANEL    EKG None  Radiology No results found.  Procedures Procedures (including critical care time)  Medications Ordered in ED Medications - No data to display   Initial Impression / Assessment and Plan / ED Course  I have reviewed the triage vital signs and the nursing  notes.  Pertinent labs & imaging results that were available during my care of the patient were reviewed by me and considered in my medical decision making (see chart for details).   Patient presents to the emergency department with complaints of right flank pain and urinary frequency.  Nontoxic-appearing, no apparent distress, vitals WNL.  He has a history of kidney stones and states this feels the same.  She has right sided CVA tenderness as well as some mild generalized abdominal tenderness without peritoneal signs.  Given patient has a history of kidney stones and states this feels the same will treat as such, will check basic labs, additional differential diagnoses include pyelonephritis, musculoskeletal etiology, also consider dissection however this seems unlikely given her hx of similar pain, no focal deficits, symmetric pulses.   CBC: No leukocytosis or anemia BMP: Unremarkable.  No electrolyte derangement.  Renal function preserved. Urinalysis: No section, doubt pyelo. no hematuria.  Following a small dose of fentanyl as well as some Phenergan patient states she is feeling much better and is requesting to be discharged.  She is tolerating p.o. without difficulty.  While with lack of hematuria nephrolithiasis seems a bit less likely this remains a possibility the source of her pain.  Would also consider musculoskeletal.  Will provide naproxen to take at home for pain control should discomfort return.  Patient has reported allergy to Flomax therefore this was not provided.  Follow-up with urology/PCP. I discussed results, treatment  plan, need for follow-up, and return precautions with the patient. Provided opportunity for questions, patient confirmed understanding and is in agreement with plan.     Final Clinical Impressions(s) / ED Diagnoses   Final diagnoses:  Flank pain    ED Discharge Orders         Ordered    naproxen (NAPROSYN) 500 MG tablet  2 times daily     12/06/18 [address]1929           Denym Christenberry R, PA-C 12/06/18 1936    Loren RacerYelverton, David, MD 12/06/18 2136

## 2018-12-06 NOTE — Discharge Instructions (Addendum)
You were seen in the emergency department today for flank pain.  Your labs were normal.  We are sending home with naproxen to help with any return of pain.  - Naproxen is a nonsteroidal anti-inflammatory medication that will help with pain and swelling. Be sure to take this medication as prescribed with food, 1 pill every 12 hours,  It should be taken with food, as it can cause stomach upset, and more seriously, stomach bleeding. Do not take other nonsteroidal anti-inflammatory medications with this such as Advil, Motrin, Aleve, Mobic, Goodie Powder, or Motrin.    You make take Tylenol per over the counter dosing with these medications.   We have prescribed you new medication(s) today. Discuss the medications prescribed today with your pharmacist as they can have adverse effects and interactions with your other medicines including over the counter and prescribed medications. Seek medical evaluation if you start to experience new or abnormal symptoms after taking one of these medicines, seek care immediately if you start to experience difficulty breathing, feeling of your throat closing, facial swelling, or rash as these could be indications of a more serious allergic reaction   Please follow-up with urology.  Return to the ER for new or worsening symptoms including but not limited to uncontrolled pain, inability to keep fluids down, fever, chills, or any other concerns.

## 2018-12-06 NOTE — ED Triage Notes (Signed)
Pt c/o right flank pain that started at noon today. C/o nausea. Denies any vomiting and fevers. States pain comes and goes. Was seen in the ED earlier for pain and states pain is not improving. States she does not have pain medications for pain.

## 2018-12-06 NOTE — ED Notes (Signed)
Tolerating po well.  Ambulated to bathroom without difficulty.  Denies having any pain.

## 2018-12-06 NOTE — ED Notes (Signed)
ED Provider at bedside. 

## 2018-12-06 NOTE — ED Notes (Signed)
Reports R sided pain "at 50,000", nausea, and diarrhea. Pt states she feels she has a kidney stone and equates her pain to kidney stones she says she has had in the past.

## 2018-12-07 ENCOUNTER — Emergency Department (HOSPITAL_BASED_OUTPATIENT_CLINIC_OR_DEPARTMENT_OTHER)
Admission: EM | Admit: 2018-12-07 | Discharge: 2018-12-07 | Disposition: A | Discharge status: Home / Self Care | Payer: Self-pay | Attending: Emergency Medicine | Admitting: Emergency Medicine

## 2018-12-07 ENCOUNTER — Encounter (HOSPITAL_BASED_OUTPATIENT_CLINIC_OR_DEPARTMENT_OTHER): Payer: Self-pay | Admitting: Emergency Medicine

## 2018-12-07 ENCOUNTER — Other Ambulatory Visit: Payer: Self-pay

## 2018-12-07 DIAGNOSIS — F17210 Nicotine dependence, cigarettes, uncomplicated: Secondary | ICD-10-CM | POA: Insufficient documentation

## 2018-12-07 DIAGNOSIS — J449 Chronic obstructive pulmonary disease, unspecified: Secondary | ICD-10-CM | POA: Insufficient documentation

## 2018-12-07 DIAGNOSIS — Z79899 Other long term (current) drug therapy: Secondary | ICD-10-CM | POA: Insufficient documentation

## 2018-12-07 DIAGNOSIS — R109 Unspecified abdominal pain: Secondary | ICD-10-CM

## 2018-12-07 DIAGNOSIS — K529 Noninfective gastroenteritis and colitis, unspecified: Secondary | ICD-10-CM | POA: Insufficient documentation

## 2018-12-07 DIAGNOSIS — I10 Essential (primary) hypertension: Secondary | ICD-10-CM | POA: Insufficient documentation

## 2018-12-07 LAB — COMPREHENSIVE METABOLIC PANEL
ALT: 24 U/L (ref 0–44)
AST: 23 U/L (ref 15–41)
Albumin: 4.1 g/dL (ref 3.5–5.0)
Alkaline Phosphatase: 43 U/L (ref 38–126)
Anion gap: 7 (ref 5–15)
BUN: 18 mg/dL (ref 6–20)
CO2: 22 mmol/L (ref 22–32)
Calcium: 8.7 mg/dL — ABNORMAL LOW (ref 8.9–10.3)
Chloride: 111 mmol/L (ref 98–111)
Creatinine, Ser: 0.8 mg/dL (ref 0.44–1.00)
GFR calc Af Amer: >60 mL/min (ref >60)
GFR calc non Af Amer: >60 mL/min (ref >60)
Glucose, Bld: 86 mg/dL (ref 70–99)
Potassium: 4.2 mmol/L (ref 3.5–5.1)
Sodium: 140 mmol/L (ref 135–145)
Total Bilirubin: 0.5 mg/dL (ref 0.3–1.2)
Total Protein: 6.8 g/dL (ref 6.5–8.1)

## 2018-12-07 LAB — LIPASE, BLOOD: Lipase: 45 U/L (ref 11–51)

## 2018-12-07 MED ORDER — KETOROLAC TROMETHAMINE 30 MG/ML IJ SOLN: 30.0000 mg | Freq: Once | INTRAMUSCULAR | Status: DC

## 2018-12-07 MED ORDER — DICYCLOMINE HCL 20 MG PO TABS: 20.0000 mg | ORAL_TABLET | Freq: Two times a day (BID) | ORAL | 0 refills | Status: DC

## 2018-12-07 MED ORDER — PROCHLORPERAZINE EDISYLATE 10 MG/2ML IJ SOLN
10.0000 mg | Freq: Once | INTRAMUSCULAR | Status: AC
  Administered 2018-12-07: 10 mg via INTRAVENOUS
  Filled 2018-12-07: qty 2

## 2018-12-07 MED ORDER — KETOROLAC TROMETHAMINE 15 MG/ML IJ SOLN
15.0000 mg | Freq: Once | INTRAMUSCULAR | Status: AC
  Administered 2018-12-07: 15 mg via INTRAVENOUS
  Filled 2018-12-07: qty 1

## 2018-12-07 MED ORDER — SODIUM CHLORIDE 0.9 % IV BOLUS
1000.0000 mL | Freq: Once | INTRAVENOUS | Status: AC
  Administered 2018-12-07: 1000 mL via INTRAVENOUS

## 2018-12-07 MED ORDER — DIPHENHYDRAMINE HCL 50 MG/ML IJ SOLN
25.0000 mg | Freq: Once | INTRAMUSCULAR | Status: AC
  Administered 2018-12-07: 12:00:00 25 mg via INTRAVENOUS
  Filled 2018-12-07: qty 1

## 2018-12-07 MED ORDER — LOPERAMIDE HCL 2 MG PO CAPS: 2.0000 mg | ORAL_CAPSULE | Freq: Four times a day (QID) | ORAL | 0 refills | Status: DC | PRN

## 2018-12-07 MED ORDER — CYCLOBENZAPRINE HCL 10 MG PO TABS: 10.0000 mg | ORAL_TABLET | Freq: Two times a day (BID) | ORAL | 0 refills | Status: DC | PRN

## 2018-12-07 MED FILL — CYCLOBENZAPRINE HCL 10 MG T: 10 | 10 days supply | Qty: 20 | Fill #0

## 2018-12-07 MED FILL — DICYCLOMINE 20 MG TABLET: 20 | 10 days supply | Qty: 20 | Fill #0

## 2018-12-07 NOTE — Discharge Instructions (Addendum)
You were seen today for flank pain and diarrhea.  You do not have a kidney stone on your CT scan.  Take Imodium as needed for diarrhea.  Take naproxen as needed for pain.

## 2018-12-07 NOTE — ED Provider Notes (Signed)
[facility] EMERGENCY DEPARTMENT Provider Note   CSN: [id number] Arrival date & time: 12/06/18  2245    History   Chief Complaint Chief Complaint  Patient presents with   Flank Pain    HPI Annette Palmer is a 63 y.o. female.     HPI  This is a 63 year old female with a history of COPD, hypertension, kidney stones who presents with right flank pain.  Patient was seen and evaluated earlier for the same.  She was presumptively treated for a kidney stone.  She was discharged with naproxen.  She did not have any definitive imaging at that time.  Patient reports ongoing pain and nausea and vomiting.  Patient reports that she has pain that is "50,000" out of 10.  She reports nausea.  She also reports recent history of ongoing diarrhea.  She states that her pain is in her right flank.  It is nonradiating.  It feels like her prior kidney stone pain.  She denies any dysuria hematuria.  Denies any fevers.  She has not been able to pick up her naproxen so has not taken anything for her pain.  I reviewed her work-up from earlier today.  Basic lab work-up is largely reassuring.  Urinalysis without UTI or hematuria.  Past Medical History:  Diagnosis Date   Anxiety    Chest pain    COPD (chronic obstructive pulmonary disease) (HCC)    Depression    Edema    lower extremities up to her knee   Hemarthrosis involving knee joint    Hemoptysis    Hypertension    Joint pain    Kidney stone    Nausea     Patient Active Problem List   Diagnosis Date Noted   COPD (chronic obstructive pulmonary disease) (HCC) 03/31/2012   Tobacco abuse 03/31/2012    Past Surgical History:  Procedure Laterality Date   ABDOMINAL HYSTERECTOMY  2010   APPENDECTOMY  2010   COLON SURGERY  2011   KNEE SURGERY     left   TUBAL LIGATION       OB History   No obstetric history on file.      Home Medications    Prior to Admission medications   Medication Sig Start Date End  Date Taking? Authorizing Provider  albuterol (PROVENTIL HFA;VENTOLIN HFA) 108 (90 BASE) MCG/ACT inhaler Inhale into the lungs every 6 (six) hours as needed. Patient uses this medication for shortness of breath.    [provider]  ALPRAZolam Prudy Feeler(XANAX) 1 MG tablet Take 1 mg by mouth at bedtime.    [provider]  amLODipine (NORVASC) 2.5 MG tablet Take 1 tablet (2.5 mg total) by mouth daily. 12/04/18   Linwood DibblesKnapp, Jon, MD  Aspirin-Salicylamide-Caffeine [facility](BC HEADACHE POWDER PO) Take by mouth as needed. For HA    [provider]  BEE POLLEN PO Take 2 capsules by mouth daily.     [provider]  clonazePAM (KLONOPIN) 0.5 MG tablet Take 1.5 mg by mouth as needed.    [provider]  Diphenhydramine-APAP, sleep, (GOODY PM PO) Take by mouth at bedtime.    [provider]  ibuprofen (ADVIL,MOTRIN) 200 MG tablet Take 800 mg by mouth 2 (two) times daily as needed. For pain    [provider]  loperamide (IMODIUM) 2 MG capsule Take 1 capsule (2 mg total) by mouth 4 (four) times daily as needed for diarrhea or loose stools. 12/07/18   Tamarra Geiselman, Mayer Maskerourtney F, MD  naproxen (NAPROSYN)  500 MG tablet Take 1 tablet (500 mg total) by mouth 2 (two) times daily. 12/06/18   Petrucelli, Samantha R, PA-C  potassium chloride SA (K-DUR,KLOR-CON) 20 MEQ tablet Take 1 tablet (20 mEq total) daily by mouth. 05/16/17   Pricilla LovelessGoldston, Scott, MD  predniSONE (DELTASONE) 10 MG tablet Take 4 tabs  daily with food x 4 days, then 3 tabs daily x 4 days, then 2 tabs daily x 4 days, then 1 tab daily x4 days then stop 03/31/12   Oretha MilchAlva, Rakesh V, MD  sertraline (ZOLOFT) 100 MG tablet Take 50 mg by mouth daily.     [provider]    Family History Family History  Problem Relation Age of Onset   Diabetes type I Mother    Hypertension Mother    Hyperlipidemia Other     Social History Social History   Tobacco Use   Smoking status: Current Every Day Smoker    Packs/day: 1.00     Types: Cigarettes   Smokeless tobacco: Never Used   Tobacco comment: Start smoking at age 63.  Currently smoking 1 ppd.    Substance Use Topics   Alcohol use: Not Currently    Comment: quit drinking Feb 10 , 2013   Drug use: No     Allergies   Penicillins; Sulfa drugs cross reactors; and Zofran [ondansetron hcl]   Review of Systems Review of Systems  Constitutional: Negative for fever.  Respiratory: Negative for shortness of breath.   Cardiovascular: Negative for chest pain.  Gastrointestinal: Positive for abdominal pain, diarrhea and nausea.  Genitourinary: Positive for flank pain. Negative for dysuria and hematuria.  All other systems reviewed and are negative.    Physical Exam Updated Vital Signs BP 137/83    Pulse 82    Temp 98.2 F (36.8 C)    Resp 20    Ht 1.676 m (5\' 6" )    SpO2 99%    BMI 15.17 kg/m   Physical Exam Vitals signs and nursing note reviewed.  Constitutional:      General: She is not in acute distress.    Appearance: She is well-developed.     Comments: thin  HENT:     Head: Normocephalic and atraumatic.     Mouth/Throat:     Mouth: Mucous membranes are dry.  Eyes:     Pupils: Pupils are equal, round, and reactive to light.  Cardiovascular:     Rate and Rhythm: Normal rate and regular rhythm.     Heart sounds: Normal heart sounds.  Pulmonary:     Effort: Pulmonary effort is normal. No respiratory distress.     Breath sounds: No wheezing.  Abdominal:     General: Bowel sounds are normal.     Palpations: Abdomen is soft.     Tenderness: There is no abdominal tenderness. There is no right CVA tenderness, left CVA tenderness, guarding or rebound.  Skin:    General: Skin is warm and dry.  Neurological:     Mental Status: She is alert and oriented to person, place, and time.  Psychiatric:        Mood and Affect: Mood normal.      ED Treatments / Results  Labs (all labs ordered are listed, but only abnormal results are displayed) Labs  Reviewed  URINALYSIS, ROUTINE W REFLEX MICROSCOPIC - Abnormal; Notable for the following components:      Result Value   Hgb urine dipstick TRACE (*)    All other components within normal limits  URINALYSIS, MICROSCOPIC (REFLEX)    EKG None  Radiology Ct Renal Stone Study  Result Date: 12/06/2018 CLINICAL DATA:  63 year old female with flank pain. Recent abdominal pain. EXAM: CT ABDOMEN AND PELVIS WITHOUT CONTRAST TECHNIQUE: Multidetector CT imaging of the abdomen and pelvis was performed following the standard protocol without IV contrast. COMPARISON:  Abdomen ultrasound 12/04/2018. CT Abdomen and Pelvis 10/09/2015. FINDINGS: Lower chest: Chronic large lung volume suspected. Mild paraseptal emphysema at the right lung base. No cardiomegaly, pericardial or pleural effusion. Hepatobiliary: The gallbladder is contracted. Negative noncontrast liver. Pancreas: Negative. Spleen: Negative. Adrenals/Urinary Tract: Stable and negative adrenal glands. No hydronephrosis, nephrolithiasis or perinephric stranding. Evidence of small chronic left renal cysts. The proximal ureters are decompressed. The distal ureters are difficult to delineate. The urinary bladder is decompressed and unremarkable. Stomach/Bowel: Decompressed rectum and distal sigmoid colon, but redundant sigmoid which is difficult to differentiate from fluid-filled small bowel in the pelvis. The descending colon is fluid-filled. The transverse colon similarly contains air and fluid. The cecum appears largely located in the pelvis today. There is a chronic distal small bowel anastomosis redemonstrated in the pelvis on series 2, image 53 with no adverse features. Fluid-filled small bowel throughout the abdomen and pelvis with no transition point identified. No mesenteric stranding. Moderately distended stomach with food. Duodenum is within normal limits. No free air or free fluid. Vascular/Lymphatic: Aortoiliac calcified atherosclerosis. Vascular  patency is not evaluated in the absence of IV contrast. No lymphadenopathy. Reproductive: Surgically absent. Other: No pelvic free fluid. Musculoskeletal: No acute osseous abnormality identified. Osteopenia. IMPRESSION: 1. Fluid-filled small and large bowel throughout the abdomen and pelvis might reflect enteritis vs. ileus and diarrhea. No transition point to suggest a mechanical bowel obstruction. A chronic distal small bowel anastomosis appears stable. 2. No urinary calculus or obstructive uropathy. 3. Aortic Atherosclerosis (ICD10-I70.0). Electronically Signed   By: Genevie Ann M.D.   On: 12/06/2018 23:42    Procedures Procedures (including critical care time)  Medications Ordered in ED Medications  promethazine (PHENERGAN) 25 MG/ML injection (has no administration in time range)  ketorolac (TORADOL) 30 MG/ML injection 30 mg (30 mg Intravenous Given 12/06/18 2346)  promethazine (PHENERGAN) injection 12.5 mg (12.5 mg Intravenous Given 12/06/18 2347)     Initial Impression / Assessment and Plan / ED Course  I have reviewed the triage vital signs and the nursing notes.  Pertinent labs & imaging results that were available during my care of the patient were reviewed by me and considered in my medical decision making (see chart for details).        Presents with recurrent pain.  Feels this is related to kidney stone.  She is overall nontoxic-appearing and vital signs are reassuring.  She does not appear significantly uncomfortable.  She also reports some diarrhea.  I have reviewed her lab work-up from earlier today which is largely reassuring.  I do not feel she needs further lab work-up at this time.  Will obtain CT scan renal stone study to evaluate for kidney stone given that she has had 2 recent visits without imaging.  Patient was given Toradol.  CT imaging does not show any evidence of obstructing kidney stone.  She does have some evidence of liquid filled stool.  She has no active vomiting and  no history concerning for obstruction.  Patient reported that she felt much better after Toradol.  Will discharge home with Imodium for diarrhea.  After history, exam, and medical workup I feel the patient has been appropriately  medically screened and is safe for discharge home. Pertinent diagnoses were discussed with the patient. Patient was given return precautions.   Final Clinical Impressions(s) / ED Diagnoses   Final diagnoses:  Flank pain  Diarrhea, unspecified type    ED Discharge Orders         Ordered    loperamide (IMODIUM) 2 MG capsule  4 times daily PRN     12/07/18 0013           Shon BatonHorton, Lynae Pederson F, MD 12/07/18 0020

## 2018-12-07 NOTE — ED Notes (Signed)
Pt. At door asking to be shown the way out.  Pt. Paper work not up for D/C as of time she was standing at door.  RN inspected the Pt. Arm for IV and the Pt. Had pulled the IV out herself.  Pt. Arm noted with paper towel And arm bleeding.  RN took pt.  Into room and placed gauze and tape over site after cleaning the area.  Paper work was given to the Pt. And Pt was walked to discharge after full instructions given to her.

## 2018-12-07 NOTE — ED Notes (Signed)
Pt. Would not allow vitals to be taken due to her room mate would not wait any longer for her she stated

## 2018-12-07 NOTE — ED Notes (Signed)
Pt states when md came in room she had onset of ha

## 2018-12-07 NOTE — ED Notes (Addendum)
Pt states " I feel real good, I have no pain" and states she's ready to go home. EDP notified. Pt was given ice water and drank it without issue.

## 2018-12-07 NOTE — ED Triage Notes (Signed)
Pt states her back still hurts.  Pt states she was "in too much damn pain" that she couldn't get her prescription filled.

## 2018-12-07 NOTE — ED Notes (Signed)
C/o rt lower back pain and nausea  Was seen yesterday and last night for same

## 2018-12-07 NOTE — ED Provider Notes (Signed)
[facility] EMERGENCY DEPARTMENT Provider Note   CSN: [id number] Arrival date & time: 12/07/18  1039    History   Chief Complaint Chief Complaint  Patient presents with   Back Pain    HPI Annette Palmer is a 63 y.o. female.     HPI   63 year old female with a history of COPD, hypertension, kidney stones who presents with right flank pain.  Patient was seen and evaluated for the same yesterday evening and again overnight, and had visits 3 and 4 days ago.   Right flank pain for 2 days, feels like kidney stone, came in last night, medication helped then pain came back again.  No dysuria. No fevers, chronic cough COPD.  Worse with movement, different positions. Severe diarrhea with eating but no increased pain.   Diarrhea 2 months, has not changed.  Every time eats. Looks like baby poop. Has not seen stomach doctor recently.  Nausea without vomiting, for 2 mos.    Taking hydrocodone syrup, phenergan   Past Medical History:  Diagnosis Date   Anxiety    Chest pain    COPD (chronic obstructive pulmonary disease) (HCC)    Depression    Edema    lower extremities up to her knee   Hemarthrosis involving knee joint    Hemoptysis    Hypertension    Joint pain    Kidney stone    Nausea     Patient Active Problem List   Diagnosis Date Noted   COPD (chronic obstructive pulmonary disease) (HCC) 03/31/2012   Tobacco abuse 03/31/2012    Past Surgical History:  Procedure Laterality Date   ABDOMINAL HYSTERECTOMY  2010   APPENDECTOMY  2010   COLON SURGERY  2011   KNEE SURGERY     left   TUBAL LIGATION       OB History   No obstetric history on file.      Home Medications    Prior to Admission medications   Medication Sig Start Date End Date Taking? Authorizing Provider  albuterol (PROVENTIL HFA;VENTOLIN HFA) 108 (90 BASE) MCG/ACT inhaler Inhale into the lungs every 6 (six) hours as needed. Patient uses this medication for shortness  of breath.    [provider]  ALPRAZolam Prudy Feeler(XANAX) 1 MG tablet Take 1 mg by mouth at bedtime.    [provider]  amLODipine (NORVASC) 2.5 MG tablet Take 1 tablet (2.5 mg total) by mouth daily. 12/04/18   Linwood DibblesKnapp, Jon, MD  Aspirin-Salicylamide-Caffeine [facility](BC HEADACHE POWDER PO) Take by mouth as needed. For HA    [provider]  BEE POLLEN PO Take 2 capsules by mouth daily.     [provider]  clonazePAM (KLONOPIN) 0.5 MG tablet Take 1.5 mg by mouth as needed.    [provider]  cyclobenzaprine (FLEXERIL) 10 MG tablet Take 1 tablet (10 mg total) by mouth 2 (two) times daily as needed for muscle spasms. 12/07/18   Alvira MondaySchlossman, Ayani Ospina, MD  dicyclomine (BENTYL) 20 MG tablet Take 1 tablet (20 mg total) by mouth 2 (two) times daily. 12/07/18   Alvira MondaySchlossman, Eithan Beagle, MD  Diphenhydramine-APAP, sleep, (GOODY PM PO) Take by mouth at bedtime.    [provider]  ibuprofen (ADVIL,MOTRIN) 200 MG tablet Take 800 mg by mouth 2 (two) times daily as needed. For pain    [provider]  loperamide (IMODIUM) 2 MG capsule Take 1 capsule (2 mg total) by mouth 4 (four) times daily as needed for diarrhea or loose stools.  12/07/18   Horton, Mayer Maskerourtney F, MD  naproxen (NAPROSYN) 500 MG tablet Take 1 tablet (500 mg total) by mouth 2 (two) times daily. 12/06/18   Petrucelli, Samantha R, PA-C  potassium chloride SA (K-DUR,KLOR-CON) 20 MEQ tablet Take 1 tablet (20 mEq total) daily by mouth. 05/16/17   Pricilla LovelessGoldston, Scott, MD  predniSONE (DELTASONE) 10 MG tablet Take 4 tabs  daily with food x 4 days, then 3 tabs daily x 4 days, then 2 tabs daily x 4 days, then 1 tab daily x4 days then stop 03/31/12   Oretha MilchAlva, Rakesh V, MD  sertraline (ZOLOFT) 100 MG tablet Take 50 mg by mouth daily.     [provider]    Family History Family History  Problem Relation Age of Onset   Diabetes type I Mother    Hypertension Mother    Hyperlipidemia Other     Social History Social History    Tobacco Use   Smoking status: Current Every Day Smoker    Packs/day: 1.00    Types: Cigarettes   Smokeless tobacco: Never Used   Tobacco comment: Start smoking at age 63.  Currently smoking 1 ppd.    Substance Use Topics   Alcohol use: Not Currently    Comment: quit drinking Feb 10 , 2013   Drug use: No     Allergies   Penicillins; Sulfa drugs cross reactors; and Zofran [ondansetron hcl]   Review of Systems Review of Systems  Constitutional: Negative for fever.  HENT: Negative for sore throat.   Eyes: Negative for visual disturbance.  Respiratory: Negative for cough and shortness of breath.   Cardiovascular: Negative for chest pain.  Gastrointestinal: Positive for abdominal pain, diarrhea and nausea. Negative for constipation and vomiting.  Genitourinary: Positive for flank pain. Negative for difficulty urinating.  Musculoskeletal: Negative for back pain and neck pain.  Skin: Negative for rash.  Neurological: Negative for syncope and headaches.     Physical Exam Updated Vital Signs BP (!) 146/84    Pulse 80    Temp 98.2 F (36.8 C) (Oral)    Resp 16    Ht 5\' 6"  (1.676 m)    Wt 42.6 kg    SpO2 100%    BMI 15.17 kg/m   Physical Exam Vitals signs and nursing note reviewed.  Constitutional:      General: She is not in acute distress.    Appearance: She is well-developed. She is not diaphoretic.  HENT:     Head: Normocephalic and atraumatic.  Eyes:     Conjunctiva/sclera: Conjunctivae normal.  Neck:     Musculoskeletal: Normal range of motion.  Cardiovascular:     Rate and Rhythm: Normal rate and regular rhythm.  Pulmonary:     Effort: Pulmonary effort is normal. No respiratory distress.  Abdominal:     General: There is no distension.     Palpations: Abdomen is soft.     Tenderness: There is abdominal tenderness (right flank, right mid abdomen). There is no guarding.  Musculoskeletal:        General: No tenderness.  Skin:    General: Skin is warm and  dry.     Findings: No erythema or rash.  Neurological:     Mental Status: She is alert and oriented to person, place, and time.      ED Treatments / Results  Labs (all labs ordered are listed, but only abnormal results are displayed) Labs Reviewed  COMPREHENSIVE METABOLIC PANEL - Abnormal; Notable for the following  components:      Result Value   Calcium 8.7 (*)    All other components within normal limits  GASTROINTESTINAL PANEL BY PCR, STOOL (REPLACES STOOL CULTURE)  LIPASE, BLOOD    EKG None  Radiology Ct Renal Stone Study  Result Date: 12/06/2018 CLINICAL DATA:  63 year old female with flank pain. Recent abdominal pain. EXAM: CT ABDOMEN AND PELVIS WITHOUT CONTRAST TECHNIQUE: Multidetector CT imaging of the abdomen and pelvis was performed following the standard protocol without IV contrast. COMPARISON:  Abdomen ultrasound 12/04/2018. CT Abdomen and Pelvis 10/09/2015. FINDINGS: Lower chest: Chronic large lung volume suspected. Mild paraseptal emphysema at the right lung base. No cardiomegaly, pericardial or pleural effusion. Hepatobiliary: The gallbladder is contracted. Negative noncontrast liver. Pancreas: Negative. Spleen: Negative. Adrenals/Urinary Tract: Stable and negative adrenal glands. No hydronephrosis, nephrolithiasis or perinephric stranding. Evidence of small chronic left renal cysts. The proximal ureters are decompressed. The distal ureters are difficult to delineate. The urinary bladder is decompressed and unremarkable. Stomach/Bowel: Decompressed rectum and distal sigmoid colon, but redundant sigmoid which is difficult to differentiate from fluid-filled small bowel in the pelvis. The descending colon is fluid-filled. The transverse colon similarly contains air and fluid. The cecum appears largely located in the pelvis today. There is a chronic distal small bowel anastomosis redemonstrated in the pelvis on series 2, image 53 with no adverse features. Fluid-filled small bowel  throughout the abdomen and pelvis with no transition point identified. No mesenteric stranding. Moderately distended stomach with food. Duodenum is within normal limits. No free air or free fluid. Vascular/Lymphatic: Aortoiliac calcified atherosclerosis. Vascular patency is not evaluated in the absence of IV contrast. No lymphadenopathy. Reproductive: Surgically absent. Other: No pelvic free fluid. Musculoskeletal: No acute osseous abnormality identified. Osteopenia. IMPRESSION: 1. Fluid-filled small and large bowel throughout the abdomen and pelvis might reflect enteritis vs. ileus and diarrhea. No transition point to suggest a mechanical bowel obstruction. A chronic distal small bowel anastomosis appears stable. 2. No urinary calculus or obstructive uropathy. 3. Aortic Atherosclerosis (ICD10-I70.0). Electronically Signed   By: Genevie Ann M.D.   On: 12/06/2018 23:42    Procedures Procedures (including critical care time)  Medications Ordered in ED Medications  sodium chloride 0.9 % bolus 1,000 mL (0 mLs Intravenous Stopped 12/07/18 1300)  diphenhydrAMINE (BENADRYL) injection 25 mg (25 mg Intravenous Given 12/07/18 1207)  prochlorperazine (COMPAZINE) injection 10 mg (10 mg Intravenous Given 12/07/18 1206)  ketorolac (TORADOL) 15 MG/ML injection 15 mg (15 mg Intravenous Given 12/07/18 1206)     Initial Impression / Assessment and Plan / ED Course  I have reviewed the triage vital signs and the nursing notes.  Pertinent labs & imaging results that were available during my care of the patient were reviewed by me and considered in my medical decision making (see chart for details).         63 year old female with a history of COPD, hypertension, kidney stones who presents with right flank pain and continuing diarrhea and nausea.  Patient was seen and evaluated for the same yesterday evening and again overnight, and had visits 3 and 4 days ago.  CT renal stone study completed last night showed no evidence  of nephrolithiasis, but did show findings consistent with either ileus or enteritis, with CT not looking consistent with small bowel obstruction.  Also clinically have low suspicion for small bowel obstruction given she is not vomiting.  I suspect her flank pain is likely secondary to this process. She had recent US which did not show  cholelithiasis. At this time I have lower suspicion for acute vascular process and do not feel there is benefit of repeat CT with contrast.  Hepatic function and lipase last checked on 6/5 so repeated again today and without abnormalities.  Pt also reports headache today, began slowly, no trauma, no fever, no neurologic symptoms, doubt acute intracranial hemorrhage or meningitis.  Given IV fluids, compazine and benadryl with improvement in symptoms. No diarrhea in the ED to send off GI pathogen/cdiff testing. Recommend follow up with GI.  Patient discharged in stable condition with understanding of reasons to return.    Final Clinical Impressions(s) / ED Diagnoses   Final diagnoses:  Enteritis  Right flank pain    ED Discharge Orders         Ordered    dicyclomine (BENTYL) 20 MG tablet  2 times daily     12/07/18 1249    cyclobenzaprine (FLEXERIL) 10 MG tablet  2 times daily PRN     12/07/18 1249           Alvira MondaySchlossman, Susann Lawhorne, MD 12/08/18 [phone]

## 2018-12-09 ENCOUNTER — Encounter (HOSPITAL_BASED_OUTPATIENT_CLINIC_OR_DEPARTMENT_OTHER): Payer: Self-pay

## 2018-12-09 ENCOUNTER — Emergency Department (HOSPITAL_BASED_OUTPATIENT_CLINIC_OR_DEPARTMENT_OTHER)
Admission: EM | Admit: 2018-12-09 | Discharge: 2018-12-09 | Discharge status: Against Medical Advice | Payer: Self-pay | Attending: Emergency Medicine | Admitting: Emergency Medicine

## 2018-12-09 ENCOUNTER — Other Ambulatory Visit: Payer: Self-pay

## 2018-12-09 DIAGNOSIS — Z5321 Procedure and treatment not carried out due to patient leaving prior to being seen by health care provider: Secondary | ICD-10-CM | POA: Insufficient documentation

## 2018-12-09 DIAGNOSIS — R109 Unspecified abdominal pain: Secondary | ICD-10-CM | POA: Insufficient documentation

## 2018-12-09 NOTE — ED Triage Notes (Signed)
Pt c/o bilat flank pain-states she was dx with kidney stone-multiple ED visits-NAD-steady gait

## 2019-01-14 ENCOUNTER — Emergency Department (HOSPITAL_BASED_OUTPATIENT_CLINIC_OR_DEPARTMENT_OTHER)
Admission: EM | Admit: 2019-01-14 | Discharge: 2019-01-14 | Disposition: A | Discharge status: Home / Self Care | Payer: Self-pay | Attending: Emergency Medicine | Admitting: Emergency Medicine

## 2019-01-14 ENCOUNTER — Encounter (HOSPITAL_BASED_OUTPATIENT_CLINIC_OR_DEPARTMENT_OTHER): Payer: Self-pay | Admitting: Emergency Medicine

## 2019-01-14 ENCOUNTER — Other Ambulatory Visit: Payer: Self-pay

## 2019-01-14 ENCOUNTER — Emergency Department (HOSPITAL_BASED_OUTPATIENT_CLINIC_OR_DEPARTMENT_OTHER): Payer: Self-pay

## 2019-01-14 DIAGNOSIS — J449 Chronic obstructive pulmonary disease, unspecified: Secondary | ICD-10-CM | POA: Insufficient documentation

## 2019-01-14 DIAGNOSIS — F17210 Nicotine dependence, cigarettes, uncomplicated: Secondary | ICD-10-CM | POA: Insufficient documentation

## 2019-01-14 DIAGNOSIS — M25461 Effusion, right knee: Secondary | ICD-10-CM | POA: Insufficient documentation

## 2019-01-14 DIAGNOSIS — Z79899 Other long term (current) drug therapy: Secondary | ICD-10-CM | POA: Insufficient documentation

## 2019-01-14 DIAGNOSIS — I10 Essential (primary) hypertension: Secondary | ICD-10-CM | POA: Insufficient documentation

## 2019-01-14 MED ORDER — ACETAMINOPHEN 500 MG PO TABS
500.0000 mg | ORAL_TABLET | Freq: Once | ORAL | Status: AC
  Administered 2019-01-14: 07:00:00 500 mg via ORAL
  Filled 2019-01-14: qty 1

## 2019-01-14 MED ORDER — AMLODIPINE BESYLATE 2.5 MG PO TABS: 2.5000 mg | ORAL_TABLET | Freq: Every day | ORAL | 0 refills | Status: AC

## 2019-01-14 NOTE — ED Triage Notes (Signed)
Pt is c/o bilateral knee pain  Pt states they have been hurting for the past 3 weeks  Pt has swelling noted

## 2019-01-14 NOTE — ED Notes (Signed)
Pt verbalized understanding of dc instructions.

## 2019-01-14 NOTE — ED Notes (Signed)
Patient transported to X-ray 

## 2019-01-14 NOTE — ED Provider Notes (Signed)
[facility] EMERGENCY DEPARTMENT Provider Note   CSN: [id number] Arrival date & time: 01/14/19  [id number]    History   Chief Complaint Chief Complaint  Patient presents with   Knee Pain    HPI Annette Palmer is a 63 y.o. female.     HPI  63 year old female presents with bilateral knee pain.  Has been going on for several weeks.  Previously had surgery to her left knee.  Today her right knee feels swollen and has been more swollen than the left.  Currently the left knee is not swollen though it has at times.  Usually the swelling in either knee is after activities.  Has taken some aspirin but no other meds for this.  No fevers but she has felt like her knee has been warm. Pain is rated as severe.  Past Medical History:  Diagnosis Date   Anxiety    Chest pain    COPD (chronic obstructive pulmonary disease) (HCC)    Depression    Edema    lower extremities up to her knee   Hemarthrosis involving knee joint    Hemoptysis    Hypertension    Joint pain    Kidney stone    Nausea     Patient Active Problem List   Diagnosis Date Noted   COPD (chronic obstructive pulmonary disease) (HCC) 03/31/2012   Tobacco abuse 03/31/2012    Past Surgical History:  Procedure Laterality Date   ABDOMINAL HYSTERECTOMY  2010   APPENDECTOMY  2010   COLON SURGERY  2011   KNEE SURGERY     left   TUBAL LIGATION       OB History   No obstetric history on file.      Home Medications    Prior to Admission medications   Medication Sig Start Date End Date Taking? Authorizing Provider  albuterol (PROVENTIL HFA;VENTOLIN HFA) 108 (90 BASE) MCG/ACT inhaler Inhale into the lungs every 6 (six) hours as needed. Patient uses this medication for shortness of breath.    [provider]  ALPRAZolam Prudy Feeler(XANAX) 1 MG tablet Take 1 mg by mouth at bedtime.    [provider]  amLODipine (NORVASC) 2.5 MG tablet Take 1 tablet (2.5 mg total) by mouth daily. 01/14/19    Pricilla LovelessGoldston, Estrellita Lasky, MD  Aspirin-Salicylamide-Caffeine [facility](BC HEADACHE POWDER PO) Take by mouth as needed. For HA    [provider]  BEE POLLEN PO Take 2 capsules by mouth daily.     [provider]  clonazePAM (KLONOPIN) 0.5 MG tablet Take 1.5 mg by mouth as needed.    [provider]  cyclobenzaprine (FLEXERIL) 10 MG tablet Take 1 tablet (10 mg total) by mouth 2 (two) times daily as needed for muscle spasms. 12/07/18   Alvira MondaySchlossman, Erin, MD  dicyclomine (BENTYL) 20 MG tablet Take 1 tablet (20 mg total) by mouth 2 (two) times daily. 12/07/18   Alvira MondaySchlossman, Erin, MD  Diphenhydramine-APAP, sleep, (GOODY PM PO) Take by mouth at bedtime.    [provider]  ibuprofen (ADVIL,MOTRIN) 200 MG tablet Take 800 mg by mouth 2 (two) times daily as needed. For pain    [provider]  loperamide (IMODIUM) 2 MG capsule Take 1 capsule (2 mg total) by mouth 4 (four) times daily as needed for diarrhea or loose stools. 12/07/18   Horton, Mayer Maskerourtney F, MD  naproxen (NAPROSYN) 500 MG tablet Take 1 tablet (500 mg total) by mouth 2 (two) times daily. 12/06/18   Petrucelli, Pleas KochSamantha R,  PA-C  potassium chloride SA (K-DUR,KLOR-CON) 20 MEQ tablet Take 1 tablet (20 mEq total) daily by mouth. 05/16/17   Pricilla LovelessGoldston, Kaedence Connelly, MD  predniSONE (DELTASONE) 10 MG tablet Take 4 tabs  daily with food x 4 days, then 3 tabs daily x 4 days, then 2 tabs daily x 4 days, then 1 tab daily x4 days then stop 03/31/12   Oretha MilchAlva, Rakesh V, MD  sertraline (ZOLOFT) 100 MG tablet Take 50 mg by mouth daily.     [provider]    Family History Family History  Problem Relation Age of Onset   Diabetes type I Mother    Hypertension Mother    Hyperlipidemia Other     Social History Social History   Tobacco Use   Smoking status: Current Every Day Smoker    Packs/day: 1.00    Types: Cigarettes   Smokeless tobacco: Never Used   Tobacco comment: Start smoking at age 63.  Currently smoking 1 ppd.      Substance Use Topics   Alcohol use: Not Currently   Drug use: No     Allergies   Penicillins, Sulfa drugs cross reactors, and Zofran [ondansetron hcl]   Review of Systems Review of Systems  Constitutional: Negative for fever.  Musculoskeletal: Positive for arthralgias and joint swelling.  Neurological: Negative for numbness.     Physical Exam Updated Vital Signs BP (!) 177/93    Pulse 93    Temp 98.3 F (36.8 C) (Oral)    Resp 20    Ht 5\' 6"  (1.676 m)    Wt 41.9 kg    SpO2 100%    BMI 14.90 kg/m   Physical Exam Vitals signs and nursing note reviewed.  Constitutional:      Appearance: She is well-developed.  HENT:     Head: Normocephalic and atraumatic.     Right Ear: External ear normal.     Left Ear: External ear normal.     Nose: Nose normal.  Eyes:     General:        Right eye: No discharge.        Left eye: No discharge.  Cardiovascular:     Rate and Rhythm: Normal rate and regular rhythm.     Pulses:          Posterior tibial pulses are 2+ on the right side and 2+ on the left side.  Pulmonary:     Effort: Pulmonary effort is normal.  Abdominal:     General: There is no distension.  Musculoskeletal:     Right knee: She exhibits swelling (mild). She exhibits normal range of motion and no erythema. Tenderness found.     Left knee: She exhibits normal range of motion and no swelling. No tenderness found.     Right lower leg: She exhibits no tenderness and no swelling.     Left lower leg: She exhibits no tenderness and no swelling.     Comments: Pain with ROM of right knee but no limited ROM. No warmth or erythema to right knee joint.  Skin:    General: Skin is warm and dry.  Neurological:     Mental Status: She is alert.  Psychiatric:        Mood and Affect: Mood is not anxious.      ED Treatments / Results  Labs (all labs ordered are listed, but only abnormal results are displayed) Labs Reviewed - No data to display  EKG None  Radiology Dg  Knee Complete 4 Views Right  Result Date: 01/14/2019 CLINICAL DATA:  Three-week history of MEDIAL RIGHT knee pain and swelling. No known injuries. EXAM: RIGHT KNEE - COMPLETE 4+ VIEW COMPARISON:  None. FINDINGS: MEDIAL soft tissue swelling. No evidence of acute, subacute or healed fractures. Well-preserved joint spaces. Borderline osseous demineralization. Small to moderate-sized joint effusion. IMPRESSION: 1. No acute or subacute osseous abnormality. 2. Small to moderate-sized joint effusion. Electronically Signed   By: Evangeline Dakin M.D.   On: 01/14/2019 08:13    Procedures Procedures (including critical care time)  Medications Ordered in ED Medications  acetaminophen (TYLENOL) tablet 500 mg (500 mg Oral Given 01/14/19 8502)     Initial Impression / Assessment and Plan / ED Course  I have reviewed the triage vital signs and the nursing notes.  Pertinent labs & imaging results that were available during my care of the patient were reviewed by me and considered in my medical decision making (see chart for details).        X-ray shows mild effusion but no other significant findings.  This seems to be waxing and waning.  She is not interested in an arthrocentesis.  I think this is pretty unlikely to be septic joint anyway given full range of motion, no increased warmth or erythema or fever.  Discussed that NSAIDs would probably be the best treatment overall but Tylenol could be included.  She notes she has poor funds and could not afford the amlodipine given to her a month ago.  I have given her good Rx coupon and represcribed the medicine.  Otherwise, I will refer her to outpatient orthopedics and we discussed return precautions.  Final Clinical Impressions(s) / ED Diagnoses   Final diagnoses:  Effusion of right knee    ED Discharge Orders         Ordered    amLODipine (NORVASC) 2.5 MG tablet  Daily     01/14/19 0824           Sherwood Gambler, MD 01/14/19 [phone]

## 2019-01-14 NOTE — Discharge Instructions (Signed)
If you develop fever, increased swelling in your knee or inability to move your knee, redness to the knee, or any other new/concerning symptoms then return to the ER for evaluation.

## 2019-01-14 NOTE — ED Notes (Signed)
ED Provider at bedside. 

## 2019-02-26 IMAGING — CR DG CHEST 2V
2 series · 2 of 2 positions shown · non-contrast
Comparison: October 08, 2015

CLINICAL DATA: Fever and dehydration

EXAM:
CHEST  2 VIEW

[w chest pa]
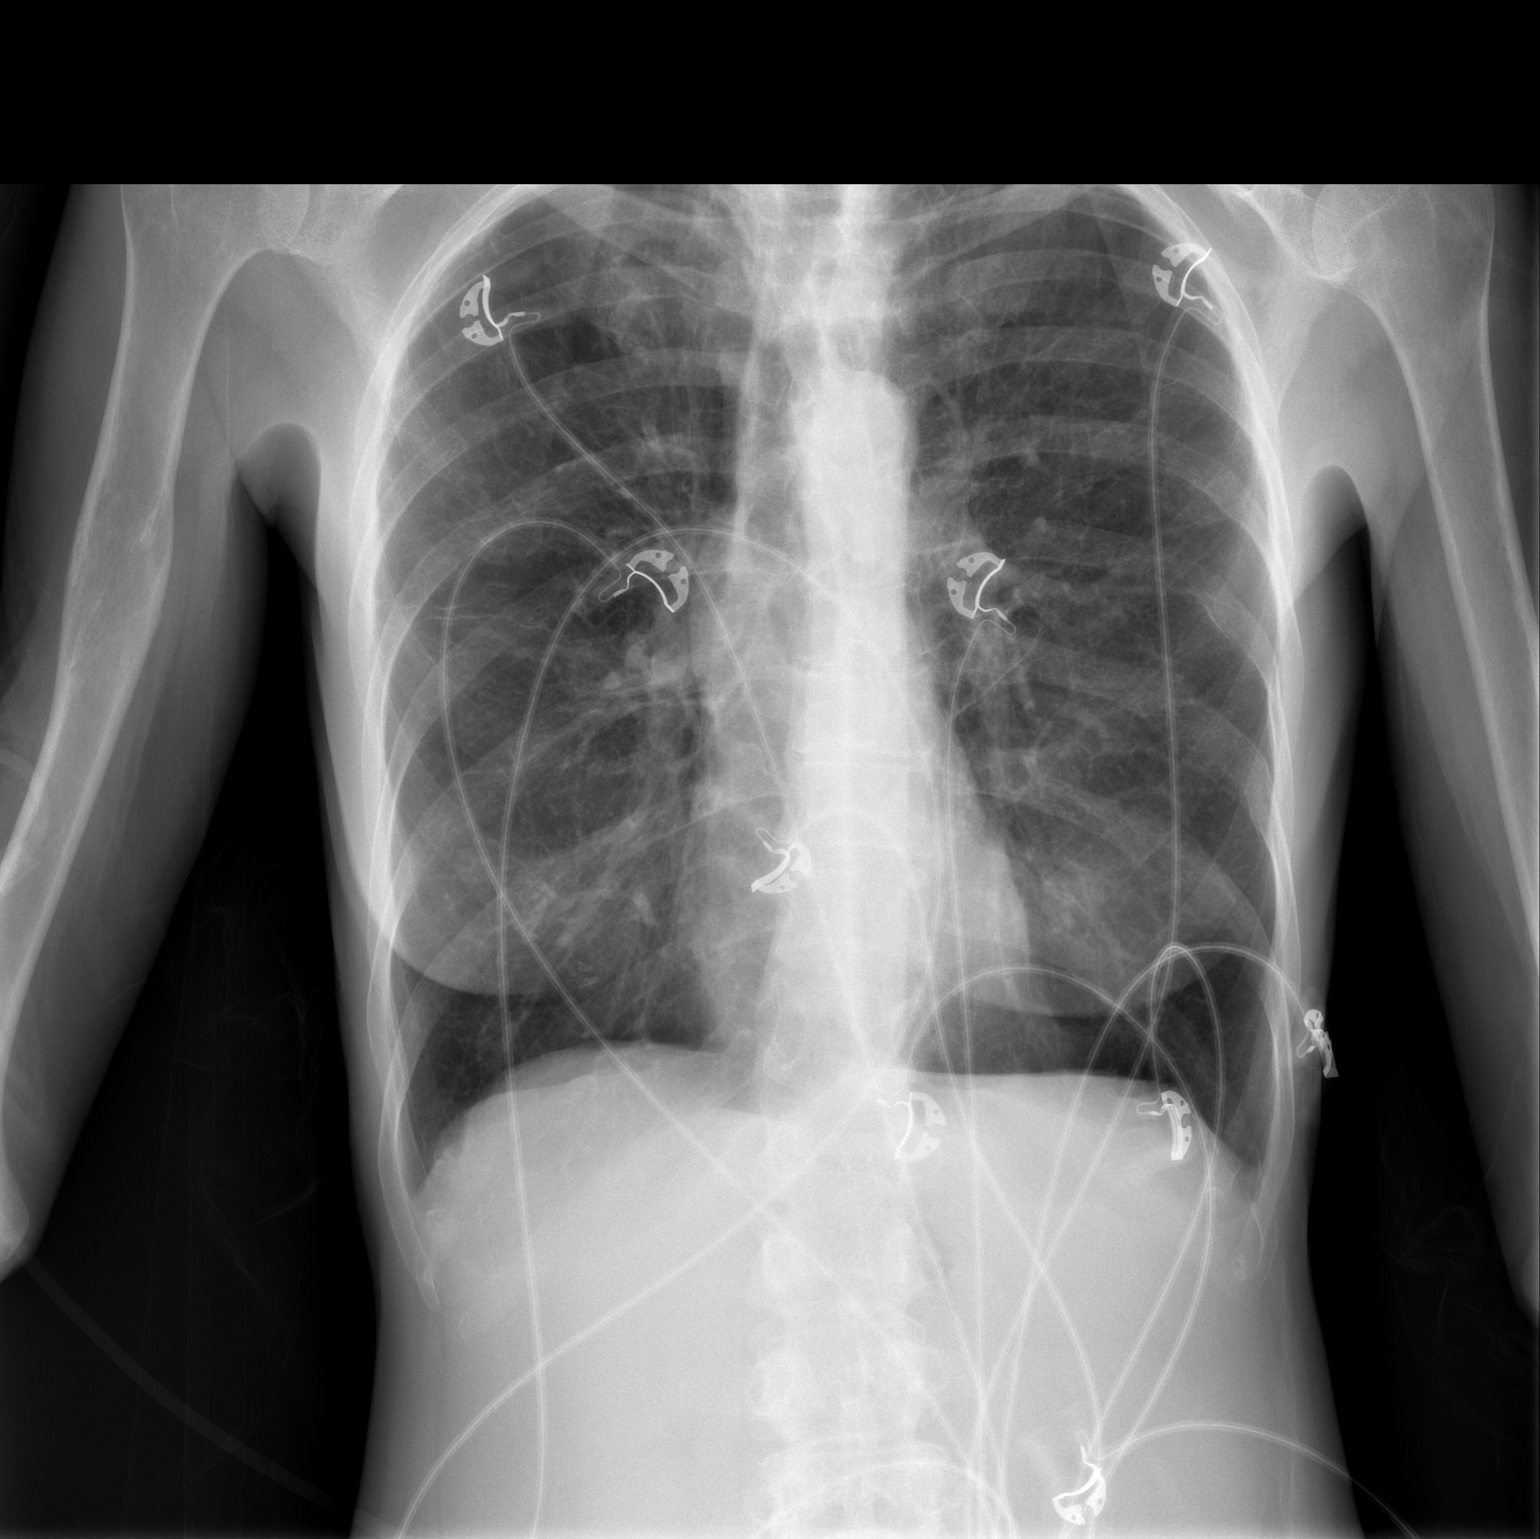

[w chest lat]
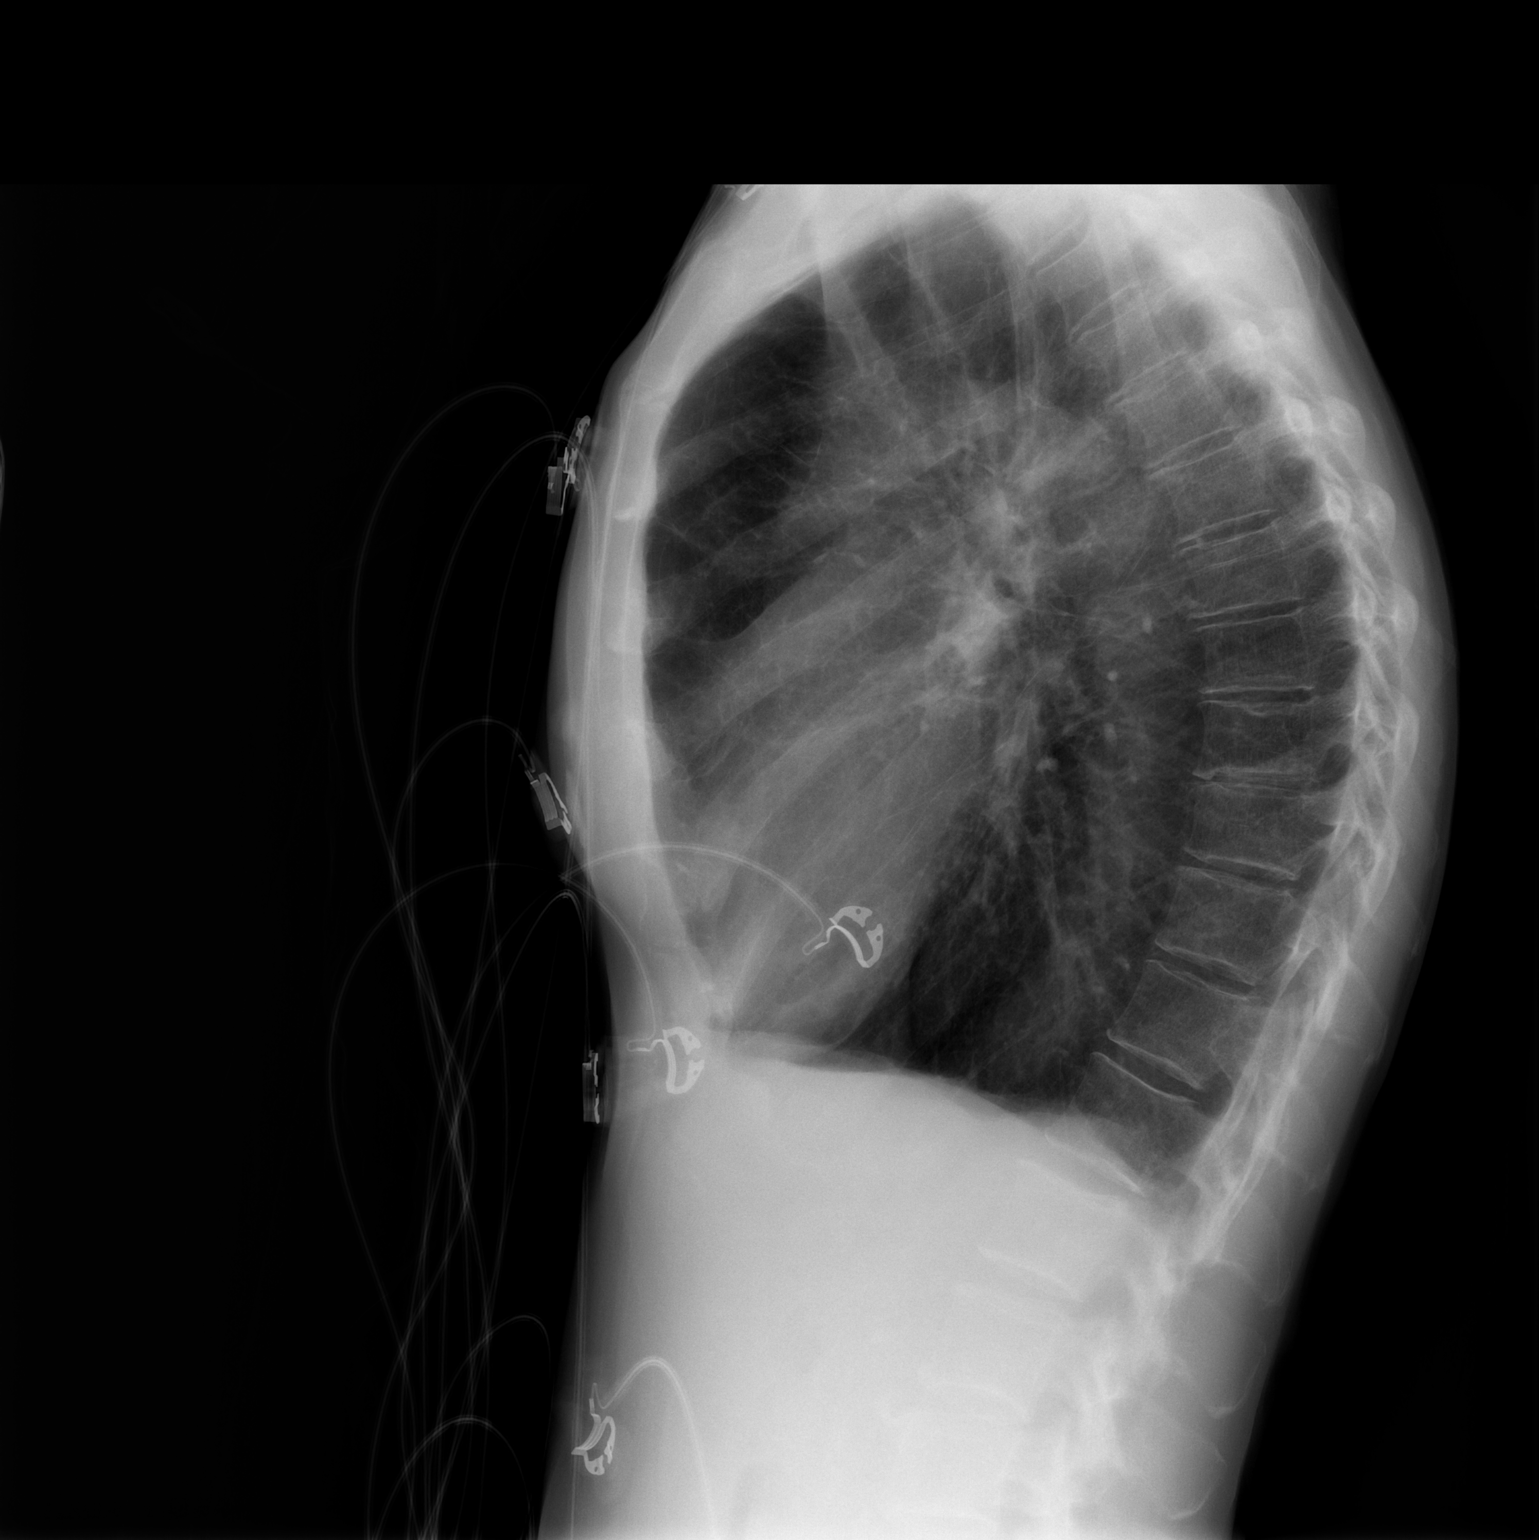

[2 of 2 positions shown; findings below may reference images not displayed]

FINDINGS: Lungs are hyperexpanded. There is mild scarring in the apices. There
is also mild scarring in each mid lung. There is no edema or
consolidation. Heart size and pulmonary vascularity are normal. No
adenopathy. No bone lesions.
IMPRESSION: Lungs hyperexpanded with scattered areas of scarring. No edema or
consolidation. Cardiac silhouette within normal limits.

## 2019-03-11 ENCOUNTER — Emergency Department (HOSPITAL_BASED_OUTPATIENT_CLINIC_OR_DEPARTMENT_OTHER): Payer: Self-pay

## 2019-03-11 ENCOUNTER — Emergency Department (HOSPITAL_BASED_OUTPATIENT_CLINIC_OR_DEPARTMENT_OTHER)
Admission: EM | Admit: 2019-03-11 | Discharge: 2019-03-11 | Disposition: A | Discharge status: Home / Self Care | Payer: Self-pay | Attending: Emergency Medicine | Admitting: Emergency Medicine

## 2019-03-11 ENCOUNTER — Encounter (HOSPITAL_BASED_OUTPATIENT_CLINIC_OR_DEPARTMENT_OTHER): Payer: Self-pay

## 2019-03-11 ENCOUNTER — Other Ambulatory Visit: Payer: Self-pay

## 2019-03-11 DIAGNOSIS — Z79899 Other long term (current) drug therapy: Secondary | ICD-10-CM | POA: Insufficient documentation

## 2019-03-11 DIAGNOSIS — J449 Chronic obstructive pulmonary disease, unspecified: Secondary | ICD-10-CM | POA: Insufficient documentation

## 2019-03-11 DIAGNOSIS — R112 Nausea with vomiting, unspecified: Secondary | ICD-10-CM | POA: Insufficient documentation

## 2019-03-11 DIAGNOSIS — Z87891 Personal history of nicotine dependence: Secondary | ICD-10-CM | POA: Insufficient documentation

## 2019-03-11 DIAGNOSIS — R1030 Lower abdominal pain, unspecified: Secondary | ICD-10-CM | POA: Insufficient documentation

## 2019-03-11 DIAGNOSIS — R197 Diarrhea, unspecified: Secondary | ICD-10-CM | POA: Insufficient documentation

## 2019-03-11 DIAGNOSIS — I10 Essential (primary) hypertension: Secondary | ICD-10-CM | POA: Insufficient documentation

## 2019-03-11 LAB — CBC WITH DIFFERENTIAL/PLATELET
Abs Immature Granulocytes: 0.01 10*3/uL (ref 0.00–0.07)
Basophils Absolute: 0 10*3/uL (ref 0.0–0.1)
Basophils Relative: 0 %
Eosinophils Absolute: 0.1 10*3/uL (ref 0.0–0.5)
Eosinophils Relative: 1 %
HCT: 42.6 % (ref 36.0–46.0)
Hemoglobin: 13.8 g/dL (ref 12.0–15.0)
Immature Granulocytes: 0 %
Lymphocytes Relative: 37 %
Lymphs Abs: 1.5 10*3/uL (ref 0.7–4.0)
MCH: 30.1 pg (ref 26.0–34.0)
MCHC: 32.4 g/dL (ref 30.0–36.0)
MCV: 93 fL (ref 80.0–100.0)
Monocytes Absolute: 0.5 10*3/uL (ref 0.1–1.0)
Monocytes Relative: 13 %
Neutro Abs: 2 10*3/uL (ref 1.7–7.7)
Neutrophils Relative %: 49 %
Platelets: 201 10*3/uL (ref 150–400)
RBC: 4.58 MIL/uL (ref 3.87–5.11)
RDW: 13.3 % (ref 11.5–15.5)
WBC: 4.1 10*3/uL (ref 4.0–10.5)
nRBC: 0 % (ref 0.0–0.2)

## 2019-03-11 LAB — URINALYSIS, ROUTINE W REFLEX MICROSCOPIC
Bilirubin Urine: NEGATIVE
Glucose, UA: NEGATIVE mg/dL
Hgb urine dipstick: NEGATIVE
Ketones, ur: NEGATIVE mg/dL
Leukocytes,Ua: NEGATIVE
Nitrite: NEGATIVE
Protein, ur: NEGATIVE mg/dL
Specific Gravity, Urine: <1.005 — ABNORMAL LOW (ref 1.005–1.030)
pH: 6 (ref 5.0–8.0)

## 2019-03-11 LAB — COMPREHENSIVE METABOLIC PANEL
ALT: 32 U/L (ref 0–44)
AST: 33 U/L (ref 15–41)
Albumin: 4 g/dL (ref 3.5–5.0)
Alkaline Phosphatase: 27 U/L — ABNORMAL LOW (ref 38–126)
Anion gap: 12 (ref 5–15)
BUN: 20 mg/dL (ref 8–23)
CO2: 26 mmol/L (ref 22–32)
Calcium: 9.2 mg/dL (ref 8.9–10.3)
Chloride: 102 mmol/L (ref 98–111)
Creatinine, Ser: 0.99 mg/dL (ref 0.44–1.00)
GFR calc Af Amer: >60 mL/min (ref >60)
GFR calc non Af Amer: >60 mL/min (ref >60)
Glucose, Bld: 92 mg/dL (ref 70–99)
Potassium: 3.5 mmol/L (ref 3.5–5.1)
Sodium: 140 mmol/L (ref 135–145)
Total Bilirubin: 0.7 mg/dL (ref 0.3–1.2)
Total Protein: 6.7 g/dL (ref 6.5–8.1)

## 2019-03-11 LAB — LIPASE, BLOOD: Lipase: 20 U/L (ref 11–51)

## 2019-03-11 MED ORDER — METOCLOPRAMIDE HCL 10 MG PO TABS: 10.0000 mg | ORAL_TABLET | Freq: Four times a day (QID) | ORAL | 0 refills | Status: DC | PRN

## 2019-03-11 MED ORDER — MORPHINE SULFATE (PF) 4 MG/ML IV SOLN
4.0000 mg | Freq: Once | INTRAVENOUS | Status: AC
  Administered 2019-03-11: 4 mg via INTRAVENOUS
  Filled 2019-03-11: qty 1

## 2019-03-11 MED ORDER — METOCLOPRAMIDE HCL 5 MG/ML IJ SOLN
10.0000 mg | Freq: Once | INTRAMUSCULAR | Status: DC
  Filled 2019-03-11: qty 2

## 2019-03-11 MED ORDER — SODIUM CHLORIDE 0.9 % IV BOLUS
1000.0000 mL | Freq: Once | INTRAVENOUS | Status: AC
  Administered 2019-03-11: 1000 mL via INTRAVENOUS

## 2019-03-11 MED ORDER — IOHEXOL 300 MG/ML  SOLN
100.0000 mL | Freq: Once | INTRAMUSCULAR | Status: AC | PRN
  Administered 2019-03-11: 100 mL via INTRAVENOUS

## 2019-03-11 MED ORDER — DIPHENHYDRAMINE HCL 50 MG/ML IJ SOLN
25.0000 mg | Freq: Once | INTRAMUSCULAR | Status: DC
  Filled 2019-03-11: qty 1

## 2019-03-11 MED ORDER — METOCLOPRAMIDE HCL 5 MG/ML IJ SOLN
10.0000 mg | Freq: Once | INTRAMUSCULAR | Status: AC
  Administered 2019-03-11: 10 mg via INTRAVENOUS

## 2019-03-11 MED ORDER — SODIUM CHLORIDE 0.9 % IV BOLUS: 1000.0000 mL | Freq: Once | INTRAVENOUS | Status: DC

## 2019-03-11 NOTE — ED Notes (Signed)
Pt aware of CT results.  Provider discussed in detail recommendation for admission.  Pt refuses admission.  Ambulates with steady gait on discharge.

## 2019-03-11 NOTE — ED Provider Notes (Signed)
[facility] EMERGENCY DEPARTMENT Provider Note   CSN: [id number] Arrival date & time: 03/11/19  1447     History   Chief Complaint Chief Complaint  Patient presents with  . Hematuria    HPI Annette Palmer is a 63 y.o. female.     Annette Palmer is a 63 y.o. female with a history of hypertension, COPD, kidney stones, tobacco abuse, depression and anxiety, who presents to the emergency department for evaluation of lower abdominal pain, diarrhea and vomiting.  Patient reports that she is has been having intermittent abdominal pains over the past 2 weeks but over the past 2 days pain has become constant and more severe.  She reports pain is present throughout the abdomen but is most notable across the lower abdomen, does not localize to one side.  She started having a few episodes of nonbloody watery diarrhea yesterday, and today had one episode of nonbloody emesis.  He reports she has been able to pass gas intermittently.  Noted today that she had a pink tent to her urine and does have a history of kidney stones and kidney infections, does report some pain radiating to her back but not up to her flank, pain does not feel similar to her previous kidney stones.  No fevers or chills.  No chest pain or shortness of breath, no cough, no sick contacts.  She has not taken anything to treat her pain prior to arrival.  Since episode of vomiting today she has had persistent nausea and has not tried to eat or drink anything.  Does have a history of multiple abdominal surgeries including appendectomy, colon surgery, abdominal hysterectomy and tubal ligation.     Past Medical History:  Diagnosis Date  . Anxiety   . Chest pain   . COPD (chronic obstructive pulmonary disease) ([place])   . Depression   . Edema    lower extremities up to her knee  . Hemarthrosis involving knee joint   . Hemoptysis   . Hypertension   . Joint pain   . Kidney stone   . Nausea     Patient Active Problem  List   Diagnosis Date Noted  . COPD (chronic obstructive pulmonary disease) ([place]) 03/31/2012  . Tobacco abuse 03/31/2012    Past Surgical History:  Procedure Laterality Date  . ABDOMINAL HYSTERECTOMY  2010  . APPENDECTOMY  2010  . COLON SURGERY  2011  . KNEE SURGERY     left  . TUBAL LIGATION       OB History   No obstetric history on file.      Home Medications    Prior to Admission medications   Medication Sig Start Date End Date Taking? Authorizing Provider  albuterol (PROVENTIL HFA;VENTOLIN HFA) 108 (90 BASE) MCG/ACT inhaler Inhale into the lungs every 6 (six) hours as needed. Patient uses this medication for shortness of breath.    [provider]  ALPRAZolam Duanne Moron) 1 MG tablet Take 1 mg by mouth at bedtime.    [provider]  amLODipine (NORVASC) 2.5 MG tablet Take 1 tablet (2.5 mg total) by mouth daily. 01/14/19   Sherwood Gambler, MD  Aspirin-Salicylamide-Caffeine [facility] HEADACHE POWDER PO) Take by mouth as needed. For HA    [provider]  BEE POLLEN PO Take 2 capsules by mouth daily.     [provider]  clonazePAM (KLONOPIN) 0.5 MG tablet Take 1.5 mg by mouth as needed.    [provider]  cyclobenzaprine (FLEXERIL) 10 MG  tablet Take 1 tablet (10 mg total) by mouth 2 (two) times daily as needed for muscle spasms. 12/07/18   Alvira Monday, MD  dicyclomine (BENTYL) 20 MG tablet Take 1 tablet (20 mg total) by mouth 2 (two) times daily. 12/07/18   Alvira Monday, MD  Diphenhydramine-APAP, sleep, (GOODY PM PO) Take by mouth at bedtime.    [provider]  ibuprofen (ADVIL,MOTRIN) 200 MG tablet Take 800 mg by mouth 2 (two) times daily as needed. For pain    [provider]  loperamide (IMODIUM) 2 MG capsule Take 1 capsule (2 mg total) by mouth 4 (four) times daily as needed for diarrhea or loose stools. 12/07/18   Horton, Mayer Masker, MD  metoCLOPramide (REGLAN) 10 MG tablet Take 1 tablet (10 mg total) by mouth every  6 (six) hours as needed for nausea (nausea/headache). 03/11/19   Dartha Lodge, PA-C  naproxen (NAPROSYN) 500 MG tablet Take 1 tablet (500 mg total) by mouth 2 (two) times daily. 12/06/18   Petrucelli, Samantha R, PA-C  potassium chloride SA (K-DUR,KLOR-CON) 20 MEQ tablet Take 1 tablet (20 mEq total) daily by mouth. 05/16/17   Pricilla Loveless, MD  predniSONE (DELTASONE) 10 MG tablet Take 4 tabs  daily with food x 4 days, then 3 tabs daily x 4 days, then 2 tabs daily x 4 days, then 1 tab daily x4 days then stop 03/31/12   Oretha Milch, MD  sertraline (ZOLOFT) 100 MG tablet Take 50 mg by mouth daily.     [provider]    Family History Family History  Problem Relation Age of Onset  . Diabetes type I Mother   . Hypertension Mother   . Hyperlipidemia Other     Social History Social History   Tobacco Use  . Smoking status: Former Smoker    Packs/day: 1.00    Types: Cigarettes  . Smokeless tobacco: Never Used  . Tobacco comment: Start smoking at age 60.  Currently smoking 1 ppd.    Substance Use Topics  . Alcohol use: Not Currently  . Drug use: No     Allergies   Penicillins, Sulfa drugs cross reactors, and Zofran [ondansetron hcl]   Review of Systems Review of Systems  Constitutional: Negative for chills and fever.  HENT: Negative.   Respiratory: Negative for cough and shortness of breath.   Cardiovascular: Negative for chest pain.  Gastrointestinal: Positive for abdominal pain, diarrhea, nausea and vomiting. Negative for blood in stool and constipation.  Genitourinary: Positive for hematuria. Negative for dysuria, flank pain, frequency, vaginal bleeding and vaginal discharge.  Musculoskeletal: Positive for back pain. Negative for arthralgias and myalgias.  Skin: Negative for color change and rash.  Neurological: Negative for dizziness, syncope and light-headedness.  All other systems reviewed and are negative.    Physical Exam Updated Vital Signs BP (!)  178/117 (BP Location: Left Arm)   Pulse 94   Temp 98.8 F (37.1 C) (Oral)   Resp 16   Ht 5\' 3"  (1.6 m)   Wt 43.1 kg   SpO2 100%   BMI 16.83 kg/m   Physical Exam Vitals signs and nursing note reviewed.  Constitutional:      General: She is not in acute distress.    Appearance: She is well-developed. She is not diaphoretic.     Comments: Patient appears thin, chronically ill, but is in no acute distress  HENT:     Head: Normocephalic and atraumatic.     Mouth/Throat:  Mouth: Mucous membranes are moist.     Pharynx: Oropharynx is clear.  Eyes:     General:        Right eye: No discharge.        Left eye: No discharge.     Pupils: Pupils are equal, round, and reactive to light.  Neck:     Musculoskeletal: Neck supple.  Cardiovascular:     Rate and Rhythm: Normal rate and regular rhythm.     Heart sounds: Normal heart sounds. No murmur. No friction rub. No gallop.   Pulmonary:     Effort: Pulmonary effort is normal. No respiratory distress.     Breath sounds: Normal breath sounds. No wheezing or rales.     Comments: Respirations equal and unlabored, patient able to speak in full sentences, lungs clear to auscultation bilaterally Abdominal:     General: Bowel sounds are normal. There is no distension.     Palpations: Abdomen is soft. There is no mass.     Tenderness: There is abdominal tenderness. There is guarding.     Comments: Abdomen is soft and nondistended with bowel sounds present, there is tenderness throughout the abdomen most notable across the lower abdomen with some guarding, no CVA tenderness bilaterally.  Musculoskeletal:        General: No deformity.  Skin:    General: Skin is warm and dry.     Capillary Refill: Capillary refill takes less than 2 seconds.  Neurological:     Mental Status: She is alert.     Coordination: Coordination normal.     Comments: Speech is clear, able to follow commands Moves extremities without ataxia, coordination intact   Psychiatric:        Mood and Affect: Mood normal.        Behavior: Behavior normal.      ED Treatments / Results  Labs (all labs ordered are listed, but only abnormal results are displayed) Labs Reviewed  COMPREHENSIVE METABOLIC PANEL - Abnormal; Notable for the following components:      Result Value   Alkaline Phosphatase 27 (*)    All other components within normal limits  URINALYSIS, ROUTINE W REFLEX MICROSCOPIC - Abnormal; Notable for the following components:   Specific Gravity, Urine <1.005 (*)    All other components within normal limits  LIPASE, BLOOD  CBC WITH DIFFERENTIAL/PLATELET    EKG None  Radiology Ct Abdomen Pelvis W Contrast  Result Date: 03/11/2019 CLINICAL DATA:  Lower abdominal pain. EXAM: CT ABDOMEN AND PELVIS WITH CONTRAST TECHNIQUE: Multidetector CT imaging of the abdomen and pelvis was performed using the standard protocol following bolus administration of intravenous contrast. CONTRAST:  OMNIPAQUE IOHEXOL 300 MG/ML  SOLN COMPARISON:  December 06, 2018 FINDINGS: Lower chest: No acute abnormality. Hepatobiliary: No focal liver abnormality is seen. No gallstones, gallbladder wall thickening, or biliary dilatation. Pancreas: Unremarkable. No pancreatic ductal dilatation or surrounding inflammatory changes. Spleen: Normal in size without focal abnormality. Adrenals/Urinary Tract: Adrenal glands are unremarkable. Kidneys are normal, without renal calculi, focal lesion, or hydronephrosis. Bladder is unremarkable. Stomach/Bowel: The stomach is decompressed. Fluid-filled distended small bowel loops in the central and lower abdomen measure up to 3.4 cm. There is additional mucosal wall thickening of small bowel loops in the lower abdomen. There is intact enterotomy line in the left pelvis, likely from prior resection of the terminal ileum. The colon has normal appearance. Vascular/Lymphatic: Aortic atherosclerosis. No enlarged abdominal or pelvic lymph nodes.  Reproductive: Status post hysterectomy. No adnexal masses.  Other: Small amount of fluid in the pelvis. Musculoskeletal: No acute or significant osseous findings. IMPRESSION: 1. Fluid-filled distended small bowel loops in the central and lower abdomen measure up to 3.4 cm, with prominent mucosal wall thickening of small bowel loops in the lower abdomen. These findings may represent enteritis with associated ileus versus intermittent/early small-bowel obstruction. No transitional point is identified. There is an intact anastomosis at the level of the terminal ileum. 2. Small amount of free fluid in the pelvis, likely reactive. Aortic Atherosclerosis (ICD10-I70.0). Electronically Signed   By: Ted Mcalpineobrinka  Dimitrova M.D.   On: 03/11/2019 17:28    Procedures Procedures (including critical care time)  Medications Ordered in ED Medications  sodium chloride 0.9 % bolus 1,000 mL (0 mLs Intravenous Stopped 03/11/19 1732)  morphine 4 MG/ML injection 4 mg (4 mg Intravenous Given 03/11/19 1600)  metoCLOPramide (REGLAN) injection 10 mg (10 mg Intravenous Given 03/11/19 1557)  iohexol (OMNIPAQUE) 300 MG/ML solution 100 mL (100 mLs Intravenous Contrast Given 03/11/19 1644)     Initial Impression / Assessment and Plan / ED Course  I have reviewed the triage vital signs and the nursing notes.  Pertinent labs & imaging results that were available during my care of the patient were reviewed by me and considered in my medical decision making (see chart for details).  63 year old female presents with lower abdominal pain, vomiting and diarrhea.  On arrival vitals are stable aside from mild hypertension.  She appears uncomfortable but is in no acute distress.  On exam she has generalized tenderness throughout the abdomen most notable across the lower with some guarding.  She is only had one episode of diarrhea, is passing gas and started having vomiting today when abdominal pain became more severe.  She also noted a pink  tinge to her urine but pain does not feel similar to her previous kidney stones and she is not having urinary symptoms.  Differential includes diverticulitis, UTI, given generalized abdominal pain and tenderness also question whether patient could have small bowel obstruction especially with her multiple previous abdominal surgeries.  Will get abdominal labs and CT abdomen pelvis, IV fluids and pain medication given.  Labs overall reassuring with no leukocytosis, no electrolyte derangements, normal renal and liver function and normal lipase, urinalysis without signs of infection.  CT abdomen pelvis shows fluid-filled distended bowel loops concerning for enteritis and ileus versus partial or intermittent small bowel obstruction, no obvious transition point noted.  Given that patient is at high risk for bowel obstruction I feel she would likely benefit from admission for bowel rest and monitoring, I discussed this with the patient and initially she was agreeable but then stated that she does not wish to be admitted, and reports for financial means she would like to be discharged home.  I discussed with patient the potential risks of leaving and not coming in for admission including worsening symptoms, persistent vomiting worsened abdominal pain, bowel obstruction leading to bowel death, sepsis and possibly death, she expressed understanding and despite this would still like to leave, she will be discharged AGAINST MEDICAL ADVICE.  Discussed strict return precautions with the patient and encouraged her to return at any time for further evaluation.  Final Clinical Impressions(s) / ED Diagnoses   Final diagnoses:  Lower abdominal pain  Non-intractable vomiting with nausea, unspecified vomiting type  Diarrhea, unspecified type    ED Discharge Orders         Ordered    metoCLOPramide (REGLAN) 10 MG tablet  Every  6 hours PRN     03/11/19 1800           Legrand RamsFord, Ciaran Begay N, PA-C 03/11/19 2237     Alvira MondaySchlossman, Erin, MD 03/13/19 [id number]

## 2019-03-11 NOTE — Discharge Instructions (Addendum)
Your CT scan shows a partial bowel obstruction versus an ileus.  Your symptoms could worsen suggesting a complete bowel obstruction if this happens you would have worsening abdominal pain, be unable to pass gas or stools and would likely have more vomiting, you could eventually develop fevers, as this can eventually cause severe infection if you have any worsening symptoms please return to the emergency department.  You can also follow-up with your regular doctor.  I would recommend bowel rest with clear liquids for the next few days until symptoms are improving.

## 2019-03-11 NOTE — ED Triage Notes (Signed)
Pt c/o hematuria started today-yesterday she had diarrhea and one episode of vomiting-states she was seen by PCP yesterday for med refills-did not address medical c/o-NAD-steady gait

## 2019-05-04 ENCOUNTER — Encounter (HOSPITAL_BASED_OUTPATIENT_CLINIC_OR_DEPARTMENT_OTHER): Payer: Self-pay | Admitting: Emergency Medicine

## 2019-05-04 ENCOUNTER — Other Ambulatory Visit: Payer: Self-pay

## 2019-05-04 ENCOUNTER — Emergency Department (HOSPITAL_BASED_OUTPATIENT_CLINIC_OR_DEPARTMENT_OTHER)
Admission: EM | Admit: 2019-05-04 | Discharge: 2019-05-04 | Discharge status: Against Medical Advice | Payer: Self-pay | Attending: Emergency Medicine | Admitting: Emergency Medicine

## 2019-05-04 DIAGNOSIS — R197 Diarrhea, unspecified: Secondary | ICD-10-CM | POA: Insufficient documentation

## 2019-05-04 DIAGNOSIS — Z79899 Other long term (current) drug therapy: Secondary | ICD-10-CM | POA: Insufficient documentation

## 2019-05-04 DIAGNOSIS — Z87891 Personal history of nicotine dependence: Secondary | ICD-10-CM | POA: Insufficient documentation

## 2019-05-04 DIAGNOSIS — J449 Chronic obstructive pulmonary disease, unspecified: Secondary | ICD-10-CM | POA: Insufficient documentation

## 2019-05-04 DIAGNOSIS — I10 Essential (primary) hypertension: Secondary | ICD-10-CM | POA: Insufficient documentation

## 2019-05-04 DIAGNOSIS — R110 Nausea: Secondary | ICD-10-CM | POA: Insufficient documentation

## 2019-05-04 DIAGNOSIS — R1084 Generalized abdominal pain: Secondary | ICD-10-CM | POA: Insufficient documentation

## 2019-05-04 MED ORDER — DICYCLOMINE HCL 10 MG/ML IM SOLN
20.0000 mg | Freq: Once | INTRAMUSCULAR | Status: AC
  Administered 2019-05-04: 20 mg via INTRAMUSCULAR
  Filled 2019-05-04: qty 2

## 2019-05-04 MED ORDER — SODIUM CHLORIDE 0.9 % IV BOLUS: 500.0000 mL | Freq: Once | INTRAVENOUS | Status: DC

## 2019-05-04 MED ORDER — PROMETHAZINE HCL 25 MG/ML IJ SOLN
12.5000 mg | Freq: Once | INTRAMUSCULAR | Status: DC
  Filled 2019-05-04: qty 1

## 2019-05-04 NOTE — ED Provider Notes (Signed)
[facility] Provider Note   CSN: [id number] Arrival date & time: 05/04/19  9563     History   Chief Complaint Chief Complaint  Patient presents with  . multiple complaints    HPI Annette Palmer is a 63 y.o. female with a hx of COPD, anxiety, depression, HTN, & prior abdominal hysterectomy/tubal ligation/appendectomy & colectomy who presents to the ED w/ complaints of abdominal pain that has been occurring for the past few days, worse since 0500 this AM. Patient states pain is located in the lower abdomen, it waxes/wanes, is currently severe, no alleviating/aggravating factors. States sometimes feels crampy and other times just hurts very badly. Reports associated nausea. States she has chronic diarrhea which has not really changed much with onset of pain/nausea. She states she had similar sxs that prompted an ED visit 03/2019- they discussed admission which she refused & GI follow up which she was unable to go to secondary to cost. She denies fever, chills, emesis, melena, hematochezia, dysuria, chest pain, or dyspnea. Last BM was this AM.    HPI  Past Medical History:  Diagnosis Date  . Anxiety   . Chest pain   . COPD (chronic obstructive pulmonary disease) ([place])   . Depression   . Edema    lower extremities up to her knee  . Hemarthrosis involving knee joint   . Hemoptysis   . Hypertension   . Joint pain   . Kidney stone   . Nausea     Patient Active Problem List   Diagnosis Date Noted  . COPD (chronic obstructive pulmonary disease) ([place]) 03/31/2012  . Tobacco abuse 03/31/2012    Past Surgical History:  Procedure Laterality Date  . ABDOMINAL HYSTERECTOMY  2010  . APPENDECTOMY  2010  . COLON SURGERY  2011  . KNEE SURGERY     left  . TUBAL LIGATION       OB History   No obstetric history on file.      Home Medications    Prior to Admission medications   Medication Sig Start Date End Date Taking? Authorizing Provider   albuterol (PROVENTIL HFA;VENTOLIN HFA) 108 (90 BASE) MCG/ACT inhaler Inhale into the lungs every 6 (six) hours as needed. Patient uses this medication for shortness of breath.    [provider]  amLODipine (NORVASC) 2.5 MG tablet Take 1 tablet (2.5 mg total) by mouth daily. 01/14/19   Sherwood Gambler, MD  Aspirin-Salicylamide-Caffeine [facility] HEADACHE POWDER PO) Take by mouth as needed. For HA    [provider]  Diphenhydramine-APAP, sleep, (GOODY PM PO) Take by mouth at bedtime.    [provider]  HYDROMET 5-1.5 MG/5ML syrup Take 5 mLs by mouth 4 (four) times daily as needed. 04/02/19   [provider]  sertraline (ZOLOFT) 100 MG tablet Take 50 mg by mouth daily.     [provider]    Family History Family History  Problem Relation Age of Onset  . Diabetes type I Mother   . Hypertension Mother   . Hyperlipidemia Other     Social History Social History   Tobacco Use  . Smoking status: Former Smoker    Packs/day: 1.00    Types: Cigarettes  . Smokeless tobacco: Never Used  . Tobacco comment: Start smoking at age 73.  Currently smoking 1 ppd.    Substance Use Topics  . Alcohol use: Not Currently  . Drug use: No     Allergies   Penicillins, Sulfa drugs cross  reactors, and Zofran [ondansetron hcl]   Review of Systems Review of Systems  Constitutional: Negative for chills and fever.  Respiratory: Negative for shortness of breath.   Cardiovascular: Negative for chest pain.  Gastrointestinal: Positive for abdominal pain, diarrhea and nausea. Negative for anal bleeding, blood in stool, constipation and vomiting.  Genitourinary: Negative for dysuria.  Neurological: Negative for syncope.  All other systems reviewed and are negative.    Physical Exam Updated Vital Signs BP (!) 148/98 (BP Location: Right Arm)   Pulse 94   Temp 98.2 F (36.8 C) (Oral)   Resp 16   Ht  (1.676 m)   Wt 41.4 kg   SpO2 99%   BMI 14.74 kg/m    Physical Exam Vitals signs and nursing note reviewed.  Constitutional:      General: She is not in acute distress.    Appearance: She is well-developed. She is not toxic-appearing.  HENT:     Head: Normocephalic and atraumatic.  Eyes:     General:        Right eye: No discharge.        Left eye: No discharge.     Conjunctiva/sclera: Conjunctivae normal.  Neck:     Musculoskeletal: Neck supple.  Cardiovascular:     Rate and Rhythm: Normal rate and regular rhythm.  Pulmonary:     Effort: Pulmonary effort is normal. No respiratory distress.     Breath sounds: Normal breath sounds. No wheezing, rhonchi or rales.  Abdominal:     General: There is no distension.     Palpations: Abdomen is soft.     Tenderness: There is abdominal tenderness (generalized, worse in the lower abdomen). There is no guarding or rebound.     Comments: Mildly increased bowel sounds.   Skin:    General: Skin is warm and dry.     Findings: No rash.  Neurological:     Mental Status: She is alert.     Comments: Clear speech.   Psychiatric:        Behavior: Behavior normal.    ED Treatments / Results  Labs (all labs ordered are listed, but only abnormal results are displayed) Labs Reviewed - No data to display  EKG None  Radiology No results found.  Procedures Procedures (including critical care time)  Medications Ordered in ED Medications - No data to display   Initial Impression / Assessment and Plan / ED Course  I have reviewed the triage vital signs and the nursing notes.  Pertinent labs & imaging results that were available during my care of the patient were reviewed by me and considered in my medical decision making (see chart for details).   Patient presents to the ED w/ complaints of lower abdominal pain over the past few days with nauea as well as continuation of her chronic diarrhea which she has experienced since her colectomy 10-15 years ago. She is nontoxic appearing, vitals w/o  significant abnormality- BP mildly elevated- doubt HTN emergency. Abdomen w/ generalized tenderness, worse in the lower quadrants, no peritoneal signs. DDX: diverticulitis, colitis, SBO, perf, ovarian cyst, fibroids, UTI, viral GI illness. Patient initially states she would like to avoid imaging, she is requesting morphine/phenergan, will trial phenergan & bentyl while check abdominal labs. If no change in sxs or concerns on labs will consider CT imaging.   --> Informed by nursing staff that patient refused all labs interventions with the exception of IM Bentyl.  Patient ultimately stated she had to leave  and no longer wished to stay.  Unfortunately did not have formal AMA discussion with the patient as she had eloped prior return to the room.   Final Clinical Impressions(s) / ED Diagnoses   Final diagnoses:  Generalized abdominal pain    ED Discharge Orders    None       Cherly Anderson, PA-C 05/04/19 1108    Little, Ambrose Finland, MD 05/04/19 1111

## 2019-05-04 NOTE — ED Notes (Signed)
Pt is refusing blood work, IV fluids and phenergan.  Pt states she cannot stay here.  She has to "get going".  Pt states she cannot stay long enough to talk to the MD.  Md made aware that patient is leaving AMA.

## 2019-05-04 NOTE — ED Triage Notes (Signed)
Pt presents for Abdominal pain that she has had for "months" and diarrhea.  Sts she has been seen for it here before and was supposed to follow up with pmd and a specialist but sts "I can't afford to go to the doctor".  Also sts her blood pressure is high and she has "kidney problems".   Pt reports all her issues as chronic.

## 2019-08-28 ENCOUNTER — Emergency Department (HOSPITAL_BASED_OUTPATIENT_CLINIC_OR_DEPARTMENT_OTHER)
Admission: EM | Admit: 2019-08-28 | Discharge: 2019-08-28 | Disposition: A | Discharge status: Home / Self Care | Payer: Medicaid Other | Attending: Emergency Medicine | Admitting: Emergency Medicine

## 2019-08-28 ENCOUNTER — Emergency Department (HOSPITAL_BASED_OUTPATIENT_CLINIC_OR_DEPARTMENT_OTHER): Payer: Medicaid Other

## 2019-08-28 ENCOUNTER — Other Ambulatory Visit: Payer: Self-pay

## 2019-08-28 DIAGNOSIS — T402X1A Poisoning by other opioids, accidental (unintentional), initial encounter: Secondary | ICD-10-CM | POA: Insufficient documentation

## 2019-08-28 DIAGNOSIS — Z87891 Personal history of nicotine dependence: Secondary | ICD-10-CM | POA: Insufficient documentation

## 2019-08-28 DIAGNOSIS — J449 Chronic obstructive pulmonary disease, unspecified: Secondary | ICD-10-CM | POA: Insufficient documentation

## 2019-08-28 DIAGNOSIS — Z79899 Other long term (current) drug therapy: Secondary | ICD-10-CM | POA: Insufficient documentation

## 2019-08-28 DIAGNOSIS — F1910 Other psychoactive substance abuse, uncomplicated: Secondary | ICD-10-CM | POA: Insufficient documentation

## 2019-08-28 DIAGNOSIS — T50901A Poisoning by unspecified drugs, medicaments and biological substances, accidental (unintentional), initial encounter: Secondary | ICD-10-CM

## 2019-08-28 DIAGNOSIS — Z20822 Contact with and (suspected) exposure to covid-19: Secondary | ICD-10-CM | POA: Insufficient documentation

## 2019-08-28 DIAGNOSIS — T40601A Poisoning by unspecified narcotics, accidental (unintentional), initial encounter: Secondary | ICD-10-CM

## 2019-08-28 DIAGNOSIS — I10 Essential (primary) hypertension: Secondary | ICD-10-CM | POA: Insufficient documentation

## 2019-08-28 LAB — RAPID URINE DRUG SCREEN, HOSP PERFORMED
Amphetamines: NOT DETECTED
Barbiturates: NOT DETECTED
Benzodiazepines: NOT DETECTED
Cocaine: NOT DETECTED
Opiates: NOT DETECTED
Tetrahydrocannabinol: NOT DETECTED

## 2019-08-28 LAB — CBG MONITORING, ED: Glucose-Capillary: 121 mg/dL — ABNORMAL HIGH (ref 70–99)

## 2019-08-28 LAB — SARS CORONAVIRUS 2 AG (30 MIN TAT): SARS Coronavirus 2 Ag: NEGATIVE

## 2019-08-28 MED ORDER — NALOXONE HCL 0.4 MG/ML IJ SOLN: 0.4000 mg | Freq: Once | INTRAMUSCULAR | Status: DC

## 2019-08-28 MED ORDER — NICOTINE 21 MG/24HR TD PT24
MEDICATED_PATCH | TRANSDERMAL | Status: AC
  Administered 2019-08-28: 17:00:00 21 mg via TRANSDERMAL
  Filled 2019-08-28: qty 1

## 2019-08-28 MED ORDER — NALOXONE HCL 4 MG/0.1ML NA LIQD
NASAL | Status: AC
  Filled 2019-08-28: qty 4

## 2019-08-28 MED ORDER — NALOXONE HCL 4 MG/0.1ML NA LIQD
1.0000 | Freq: Once | NASAL | Status: AC
  Administered 2019-08-28: 16:00:00 1 via NASAL

## 2019-08-28 MED ORDER — NICOTINE 14 MG/24HR TD PT24
14.0000 mg | MEDICATED_PATCH | Freq: Once | TRANSDERMAL | Status: DC
  Filled 2019-08-28: qty 1

## 2019-08-28 MED ORDER — ONDANSETRON HCL 4 MG/2ML IJ SOLN
4.0000 mg | Freq: Once | INTRAMUSCULAR | Status: AC
  Administered 2019-08-28: 16:00:00 4 mg via INTRAVENOUS
  Filled 2019-08-28: qty 2

## 2019-08-28 MED ORDER — NALOXONE HCL 0.4 MG/ML IJ SOLN
INTRAMUSCULAR | Status: AC
  Filled 2019-08-28: qty 1

## 2019-08-28 MED ORDER — NICOTINE 21 MG/24HR TD PT24: 21.0000 mg | MEDICATED_PATCH | Freq: Once | TRANSDERMAL | Status: DC

## 2019-08-28 MED ORDER — NALOXONE HCL 0.4 MG/ML IJ SOLN
0.2000 mg | Freq: Once | INTRAMUSCULAR | Status: AC
  Administered 2019-08-28: 16:00:00 0.2 mg via INTRAVENOUS

## 2019-08-28 NOTE — ED Triage Notes (Addendum)
Pt drank "a half a bottle of cough syrup." Family states the cough syrup contains codeine.  Pt is obtunded and falling asleep during triage. Pinpoint pupils.

## 2019-08-28 NOTE — ED Provider Notes (Signed)
[facility] Provider Note   CSN: [id number] Arrival date & time: 08/28/19  1551     History Chief Complaint  Patient presents with  . Drug Overdose    Annette Palmer is a 64 y.o. female w/ hx of polysubstance abuse, COPD, presenting to the ED with suspected drug overdose.  EMS was called to scene after patient was found down in her yard today.  History provided by patient's brother at the bedside.  He tells me,  "She was home with her nephew, and he saw her go outside to a car with dark-tinted windows.  He wasn't watching afterwards, but this guy he didn't know came running into the house yelling that she overdosed.  She was laying in the grass in the yard, barely breathing."  EMS reports they gave 2 rounds of intranasal narcan with minimal response.  She had pinpoint pupils and was breathing at 8 breaths per minute, hypoxic to 88% on room air.  The patient is prescribed codeine cough syrup by her PCP.  Her brother tells me that she "chugs it every day."  He also tells me that she has a history of using opioids like heroin, and he believes she may have bought some today and used it.  HPI     Past Medical History:  Diagnosis Date  . Anxiety   . Chest pain   . COPD (chronic obstructive pulmonary disease) ([place])   . Depression   . Edema    lower extremities up to her knee  . Hemarthrosis involving knee joint   . Hemoptysis   . Hypertension   . Joint pain   . Kidney stone   . Nausea     Patient Active Problem List   Diagnosis Date Noted  . COPD (chronic obstructive pulmonary disease) ([place]) 03/31/2012  . Tobacco abuse 03/31/2012    Past Surgical History:  Procedure Laterality Date  . ABDOMINAL HYSTERECTOMY  2010  . APPENDECTOMY  2010  . COLON SURGERY  2011  . KNEE SURGERY     left  . TUBAL LIGATION       OB History   No obstetric history on file.     Family History  Problem Relation Age of Onset  . Diabetes type I Mother   .  Hypertension Mother   . Hyperlipidemia Other     Social History   Tobacco Use  . Smoking status: Former Smoker    Packs/day: 1.00    Types: Cigarettes  . Smokeless tobacco: Never Used  . Tobacco comment: Start smoking at age 48.  Currently smoking 1 ppd.    Substance Use Topics  . Alcohol use: Not Currently  . Drug use: No    Home Medications Prior to Admission medications   Medication Sig Start Date End Date Taking? Authorizing Provider  albuterol (PROVENTIL HFA;VENTOLIN HFA) 108 (90 BASE) MCG/ACT inhaler Inhale into the lungs every 6 (six) hours as needed. Patient uses this medication for shortness of breath.    [provider]  amLODipine (NORVASC) 2.5 MG tablet Take 1 tablet (2.5 mg total) by mouth daily. 01/14/19   Sherwood Gambler, MD  Aspirin-Salicylamide-Caffeine [facility] HEADACHE POWDER PO) Take by mouth as needed. For HA    [provider]  Diphenhydramine-APAP, sleep, (GOODY PM PO) Take by mouth at bedtime.    [provider]  HYDROMET 5-1.5 MG/5ML syrup Take 5 mLs by mouth 4 (four) times daily as needed. 04/02/19   [provider]  sertraline (  ZOLOFT) 100 MG tablet Take 50 mg by mouth daily.     [provider]    Allergies    Penicillins, Sulfa drugs cross reactors, and Zofran [ondansetron hcl]  Review of Systems   Review of Systems  Unable to perform ROS: Patient unresponsive    Physical Exam Updated Vital Signs BP (!) 141/75 (BP Location: Left Arm)   Pulse 88   Temp 97.8 F (36.6 C) (Oral)   Resp 18   Wt 42.9 kg   SpO2 95%   BMI 15.25 kg/m   Physical Exam Vitals and nursing note reviewed.  Constitutional:      Appearance: She is well-developed.     Comments: Thin habitus Snoring, unresponsive to voice  HENT:     Head: Normocephalic and atraumatic.  Eyes:     Conjunctiva/sclera: Conjunctivae normal.     Comments: Pinpoint pupils  Cardiovascular:     Rate and Rhythm: Regular rhythm. Bradycardia present.      Pulses: Normal pulses.  Pulmonary:     Comments: RR 8, 88% on room air, mild wheezing bilaterally, no crackles or rales Musculoskeletal:     Cervical back: Neck supple.  Skin:    General: Skin is warm and dry.  Neurological:     GCS: GCS eye subscore is 1. GCS verbal subscore is 2. GCS motor subscore is 5.     Comments: Somnolent on arrival, but localizing to pain     ED Results / Procedures / Treatments   Labs (all labs ordered are listed, but only abnormal results are displayed) Labs Reviewed  CBG MONITORING, ED - Abnormal; Notable for the following components:      Result Value   Glucose-Capillary 121 (*)    All other components within normal limits  SARS CORONAVIRUS 2 AG (30 MIN TAT)  RAPID URINE DRUG SCREEN, HOSP PERFORMED    EKG EKG Interpretation  Date/Time:  Saturday August 28 2019 16:12:02 EST Ventricular Rate:  89 PR Interval:    QRS Duration: 89 QT Interval:  363 QTC Calculation: 442 R Axis:   71 Text Interpretation: Sinus tachycardia Ventricular premature complex Short PR interval Nonspecific repol abnormality, diffuse leads No STEMI Confirmed by Alvester Chou [phone]) on 08/28/2019 4:16:07 PM   Radiology DG Chest Portable 1 View  Result Date: 08/28/2019 CLINICAL DATA:  COPD. Possible overdose. EXAM: PORTABLE CHEST 1 VIEW COMPARISON:  05/16/2017 FINDINGS: Numerous leads and wires project over the chest. Remote left rib fractures. Midline trachea. Normal heart size. Atherosclerosis in the transverse aorta. No pleural effusion or pneumothorax. Hyperinflation and interstitial thickening. Right upper lobe scarring laterally. No lobar consolidation. IMPRESSION: COPD/chronic bronchitis, without acute superimposed process. Aortic Atherosclerosis (ICD10-I70.0). Electronically Signed   By: Jeronimo Greaves M.D.   On: 08/28/2019 16:26    Procedures .Critical Care Performed by: Terald Sleeper, MD Authorized by: Terald Sleeper, MD   Critical care provider  statement:    Critical care time (minutes):  48   Critical care was necessary to treat or prevent imminent or life-threatening deterioration of the following conditions:  Respiratory failure   Critical care was time spent personally by me on the following activities:  Discussions with consultants, evaluation of patient's response to treatment, examination of patient, ordering and performing treatments and interventions, ordering and review of laboratory studies, ordering and review of radiographic studies, pulse oximetry, re-evaluation of patient's condition, obtaining history from patient or surrogate and review of old charts Comments:     Opioid overdose requiring multiple  rounds of narcan, frequent bedside reassessment   (including critical care time)  Medications Ordered in ED Medications  naloxone (NARCAN) injection 0.4 mg (has no administration in time range)  nicotine (NICODERM CQ - dosed in mg/24 hours) patch 21 mg (21 mg Transdermal Patch Applied 08/28/19 1712)  naloxone (NARCAN) nasal spray 4 mg/0.1 mL (1 spray Nasal Provided for home use 08/28/19 1553)  ondansetron (ZOFRAN) injection 4 mg (4 mg Intravenous Given 08/28/19 1612)  naloxone (NARCAN) injection 0.2 mg (0.2 mg Intravenous Given 08/28/19 1614)    ED Course  I have reviewed the triage vital signs and the nursing notes.  Pertinent labs & imaging results that were available during my care of the patient were reviewed by me and considered in my medical decision making (see chart for details).  64 yo female presenting to the ED with suspected opioid overdose, per her family member's history at bedside, and consistent also with her hypopnea and pinpoint pupils.  No evidence of acute head trauma, and with a reported fall into grass from standing height, I doubt she sustained a clinically significant head injury.    She responded quickly to IV narcan here.  We'll monitor for rebound effect.  Her O2 sat of 88% is probably close to her  baseline with her COPD history.  No sign of aspiration on xray ECG stable here  Doubt this was an acute cardiac event or stroke today.   Clinical Course as of Aug 27 2114  Sat Aug 28, 2019  1609 Pinpoint pupils on arrival despite 2 rounds intranasal narcan by EMS and one round here.  Will try IV narcan now.  Hypopneic as well.  Per history provided by her brother in the Ed, strongly suspicous for opioid overdose   [MT]  1615 Immediately woke up after 0.2 mg of narcan.  No vomiting. Patient reports she drank "cough syrup" today, denies buying or using any other drugs like heroin or fentanyl.  Her brother at the bedside tells me, "She'll lie to you, she won't tell you if she used it."   [MT]  1652 I contacted the patient's PCP's office Dr Royston Sinner Corrington who prescribes her monthly hydromet and left a message with his after-hours secretary informing him of the ED visit and overdose, and recommending a different prescription moving forward.   [MT]  1721 Still awake, stable   [MT]  2008 Wide awake, low concern for rebound overdose after this period of observation.  She is ambulatory.  Will discharge home with her brother who is driving her   [MT]    Clinical Course User Index [MT] Tuck Dulworth, Kermit Balo, MD    Final Clinical Impression(s) / ED Diagnoses Final diagnoses:  Accidental drug overdose, initial encounter  Opiate overdose, accidental or unintentional, initial encounter [facility])    Rx / DC Orders ED Discharge Orders    None       Terald Sleeper, MD 08/28/19 2116

## 2019-08-28 NOTE — ED Notes (Signed)
ED Nursing Discharge Note: VSS, alert and oriented, GCS 15. MAE x 4, gait very steady when walking. AVS reviewed with pt, also had discussion regards to safety when taking certain medications. Also encouraged pt to speak with her primary care MD for follow up. Copy of AVS provided, teach back done with pt and son. Escorted to exit via wheelchair.

## 2019-08-28 NOTE — Discharge Instructions (Signed)
You were treated in the ER for an opioid overdose today.   This was a life-threatening event.  Without medical care, you could have died from this drug overdose.  The cough medicine you use at home contains opioids.  You should NOT be using this medicine daily, and you should never take more than prescribed.  I strongly recommend that you stop using this altogether, and ask your doctor for an alternative medicine that does not contain opioids.

## 2020-09-15 IMAGING — US ULTRASOUND ABDOMEN COMPLETE
1 series · 14 of 25 positions shown · non-contrast
Comparison: Abdomen series 12/04/2018.  CT 7376.

CLINICAL DATA: Abdominal pain.

EXAM:
ABDOMEN ULTRASOUND COMPLETE

[Series 1: ultrasound abdomen complete · 14 of 133 slices shown]
[im 1/133]
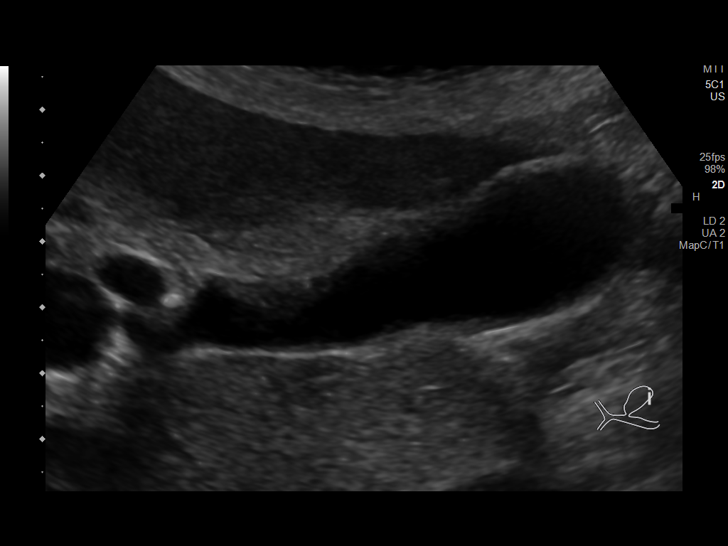
[im 12/133]
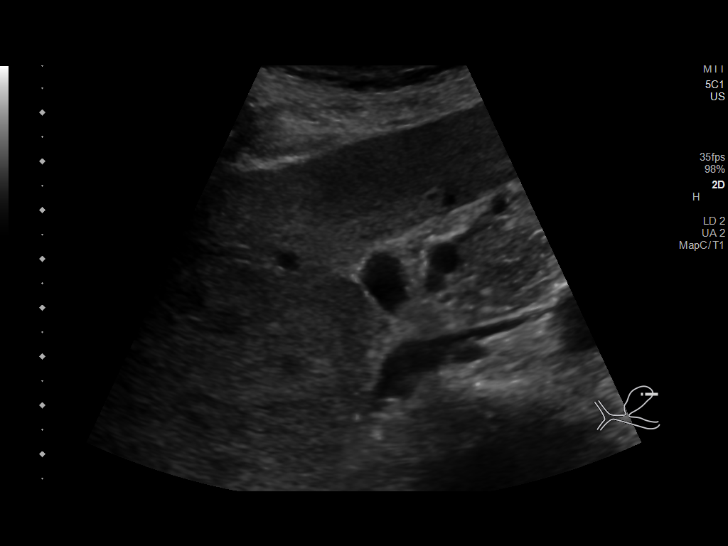
[im 23/133]
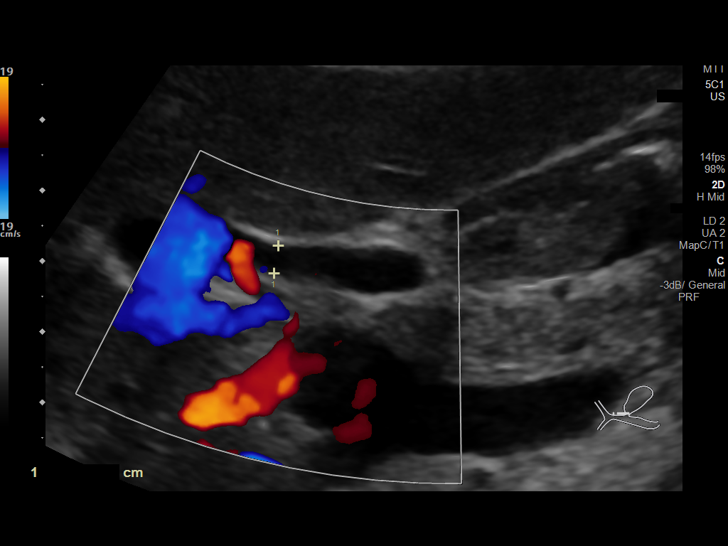
[im 34/133]
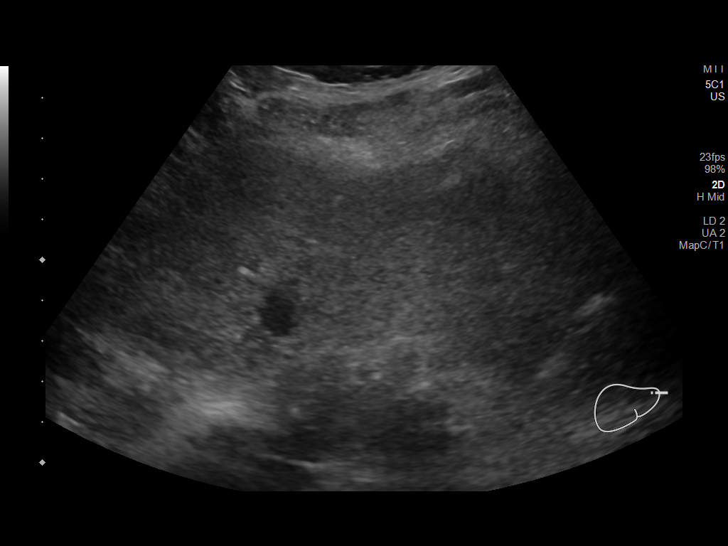
[im 45/133]
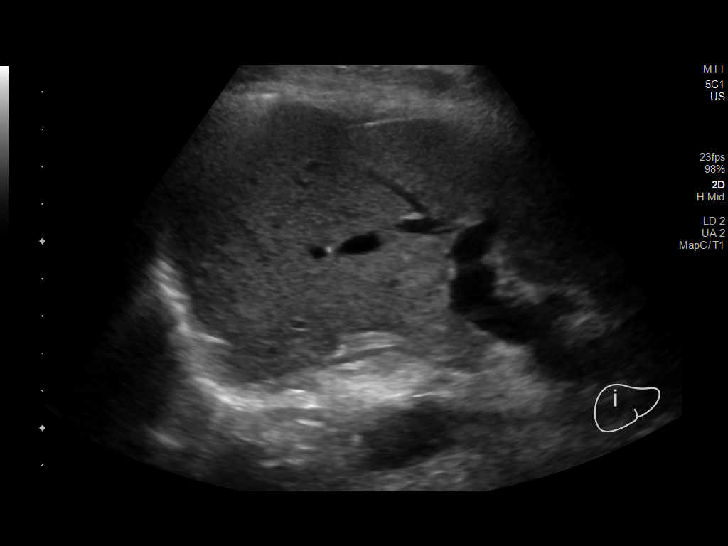
[im 50/133]
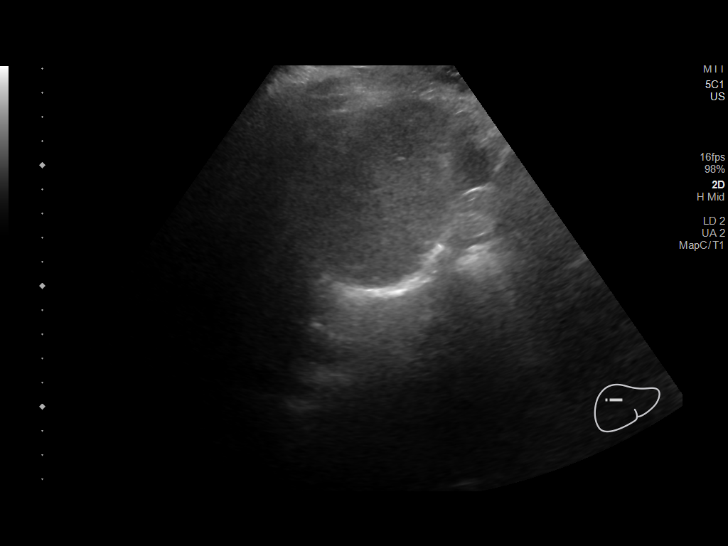
[im 61/133]
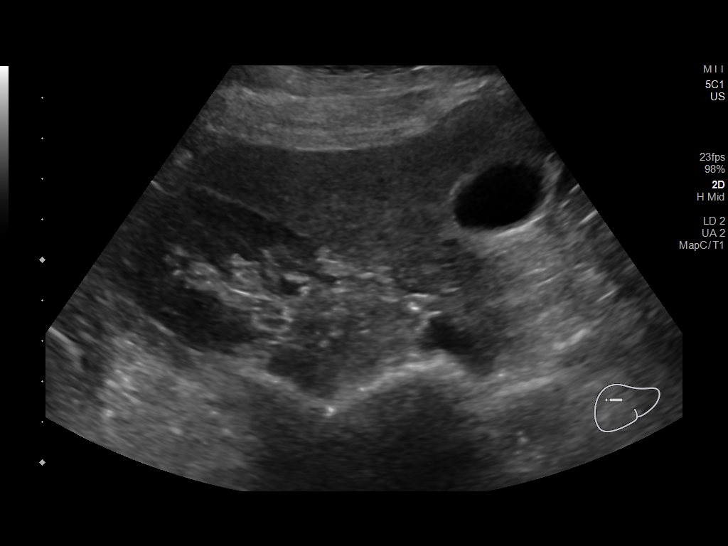
[im 72/133]
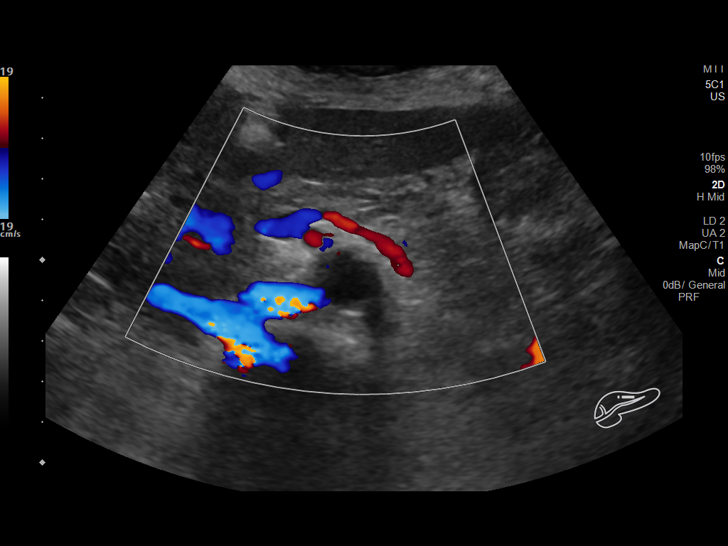
[im 83/133]
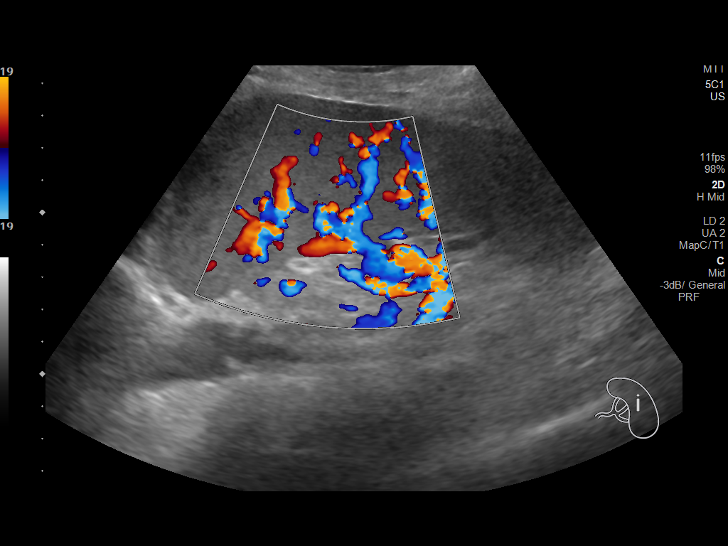
[im 89/133]
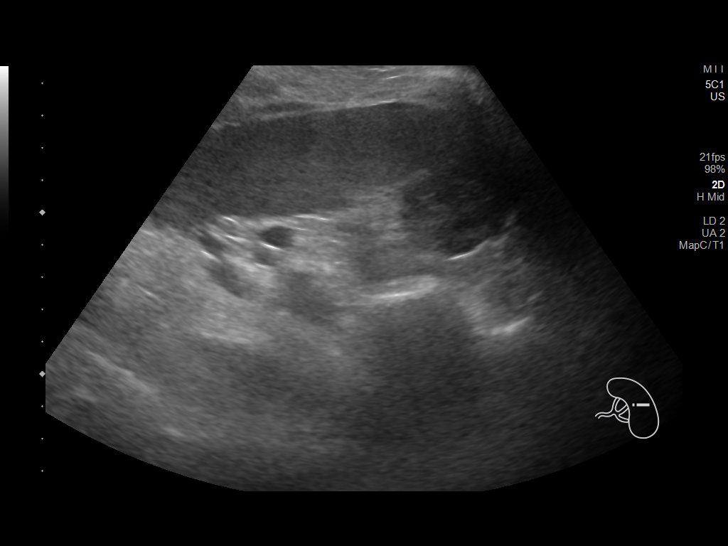
[im 100/133]
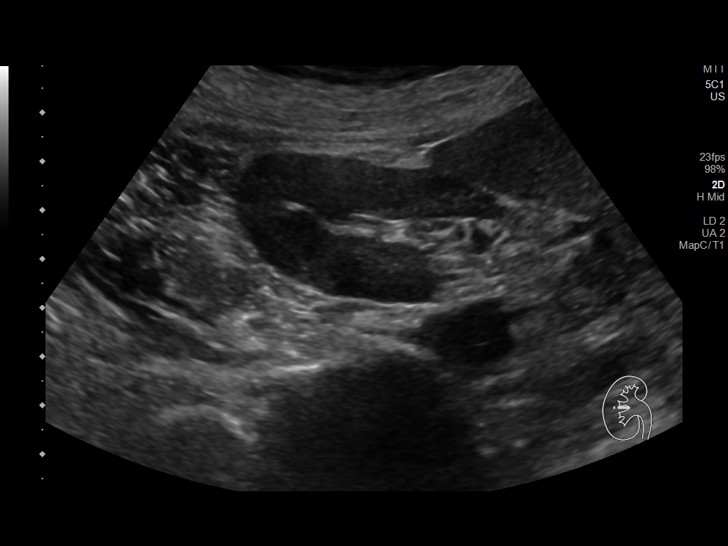
[im 111/133]
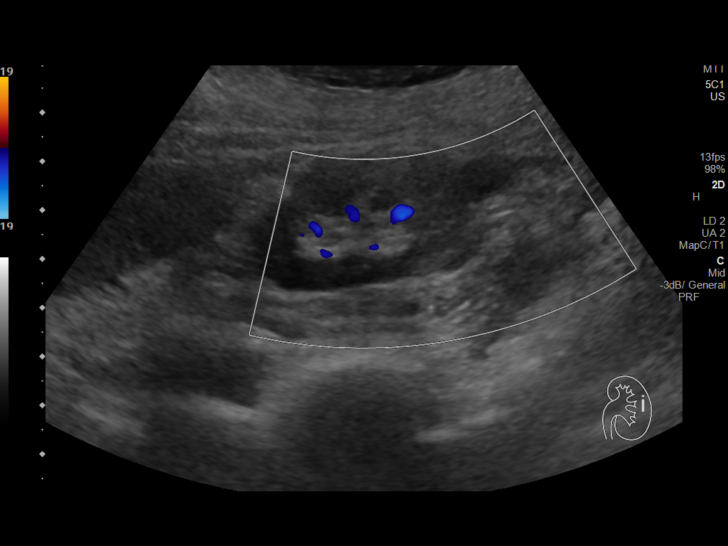
[im 122/133]
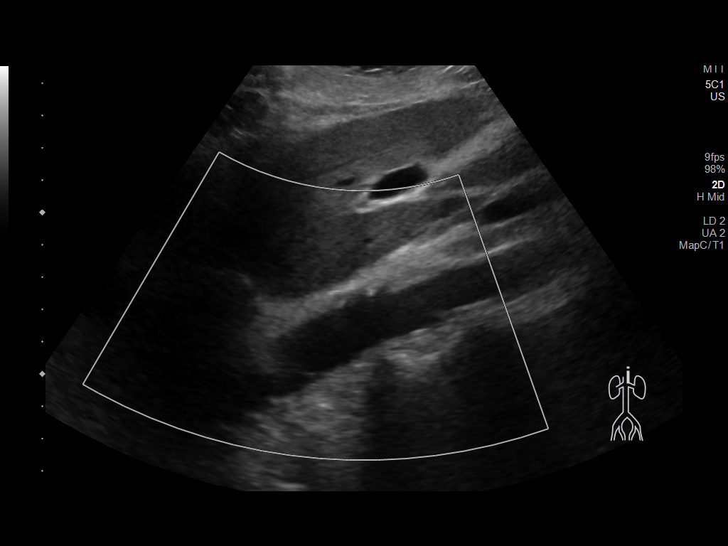
[im 133/133]
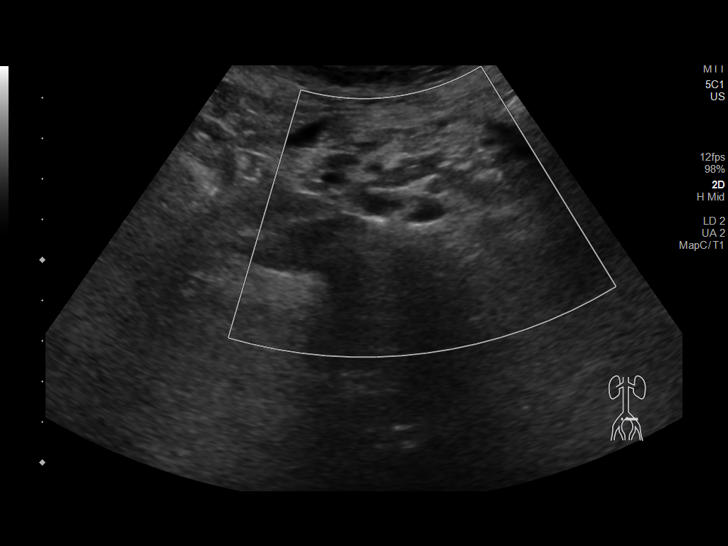

[14 of 25 positions shown; findings below may reference images not displayed]

FINDINGS: Gallbladder: No gallstones. Gallbladder wall thickness 1.7 mm.
Negative Murphy sign.

Common bile duct: Diameter: 5.8 mm

Liver: Mild increased echogenicity consistent fatty infiltration or
hepatocellular disease. Portal vein is patent on color Doppler
imaging with normal direction of blood flow towards the liver.

IVC: No abnormality visualized.

Pancreas: Visualized portion unremarkable.

Spleen: Spleen is prominent at 13.2 cm with a volume of 473 cc.

Right Kidney: Length: 8.9 cm. Echogenicity within normal limits. No
mass or hydronephrosis visualized.

Left Kidney: Length: 9.9 cm. Echogenicity within normal limits.
cm simple cyst. No hydronephrosis visualized.

Abdominal aorta: No aneurysm visualized.

Other findings: None.
IMPRESSION: 1. No gallstones or biliary distention. No acute abnormality
identified.

2. Mild increased hepatic echogenicity suggesting fatty
infiltration. Spleen is prominent at 13.2 cm with a volume of 473
cc.

## 2020-10-29 DEATH — deceased
